# Patient Record
Sex: Female | Born: 1945 | Race: White | Hispanic: No | Smoking: Never smoker
Health system: Southern US, Community
[De-identification: ages and names within clinical notes are randomized; demographics above are authoritative.]

## PROBLEM LIST (undated history)

## (undated) DIAGNOSIS — I10 Essential (primary) hypertension: Secondary | ICD-10-CM

## (undated) DIAGNOSIS — E785 Hyperlipidemia, unspecified: Secondary | ICD-10-CM

## (undated) DIAGNOSIS — I272 Pulmonary hypertension, unspecified: Secondary | ICD-10-CM

## (undated) DIAGNOSIS — M5136 Other intervertebral disc degeneration, lumbar region: Secondary | ICD-10-CM

## (undated) DIAGNOSIS — I350 Nonrheumatic aortic (valve) stenosis: Secondary | ICD-10-CM

## (undated) DIAGNOSIS — M51369 Other intervertebral disc degeneration, lumbar region without mention of lumbar back pain or lower extremity pain: Secondary | ICD-10-CM

## (undated) DIAGNOSIS — I5042 Chronic combined systolic (congestive) and diastolic (congestive) heart failure: Secondary | ICD-10-CM

## (undated) DIAGNOSIS — I340 Nonrheumatic mitral (valve) insufficiency: Secondary | ICD-10-CM

## (undated) DIAGNOSIS — I739 Peripheral vascular disease, unspecified: Secondary | ICD-10-CM

## (undated) DIAGNOSIS — E041 Nontoxic single thyroid nodule: Secondary | ICD-10-CM

## (undated) DIAGNOSIS — N189 Chronic kidney disease, unspecified: Secondary | ICD-10-CM

## (undated) DIAGNOSIS — Z952 Presence of prosthetic heart valve: Secondary | ICD-10-CM

## (undated) DIAGNOSIS — K219 Gastro-esophageal reflux disease without esophagitis: Secondary | ICD-10-CM

## (undated) DIAGNOSIS — I2510 Atherosclerotic heart disease of native coronary artery without angina pectoris: Secondary | ICD-10-CM

## (undated) DIAGNOSIS — E119 Type 2 diabetes mellitus without complications: Secondary | ICD-10-CM

## (undated) HISTORY — DX: Atherosclerotic heart disease of native coronary artery without angina pectoris: I25.10

## (undated) HISTORY — DX: Nonrheumatic mitral (valve) insufficiency: I34.0

## (undated) HISTORY — DX: Hyperlipidemia, unspecified: E78.5

## (undated) HISTORY — DX: Pulmonary hypertension, unspecified: I27.20

## (undated) HISTORY — DX: Essential (primary) hypertension: I10

## (undated) HISTORY — DX: Type 2 diabetes mellitus without complications: E11.9

## (undated) HISTORY — DX: Nontoxic single thyroid nodule: E04.1

## (undated) HISTORY — DX: Chronic kidney disease, unspecified: N18.9

## (undated) HISTORY — DX: Peripheral vascular disease, unspecified: I73.9

## (undated) HISTORY — DX: Other intervertebral disc degeneration, lumbar region: M51.36

## (undated) HISTORY — DX: Other intervertebral disc degeneration, lumbar region without mention of lumbar back pain or lower extremity pain: M51.369

## (undated) HISTORY — DX: Gastro-esophageal reflux disease without esophagitis: K21.9

## (undated) HISTORY — PX: PARTIAL HYSTERECTOMY: SHX80

---

## 2006-12-15 HISTORY — PX: PERCUTANEOUS CORONARY STENT INTERVENTION (PCI-S): SHX6016

## 2007-12-16 HISTORY — PX: CORONARY ARTERY BYPASS GRAFT: SHX141

## 2013-10-11 DIAGNOSIS — Z9889 Other specified postprocedural states: Secondary | ICD-10-CM | POA: Insufficient documentation

## 2017-01-07 DIAGNOSIS — Z7902 Long term (current) use of antithrombotics/antiplatelets: Secondary | ICD-10-CM | POA: Insufficient documentation

## 2018-03-04 ENCOUNTER — Institutional Professional Consult (permissible substitution) (INDEPENDENT_AMBULATORY_CARE_PROVIDER_SITE_OTHER): Payer: Medicare Other | Admitting: Thoracic Surgery (Cardiothoracic Vascular Surgery)

## 2018-03-04 VITALS — BP 133/63 | HR 62 | Resp 20 | Ht 63.0 in

## 2018-03-04 DIAGNOSIS — I272 Pulmonary hypertension, unspecified: Secondary | ICD-10-CM | POA: Insufficient documentation

## 2018-03-04 DIAGNOSIS — E785 Hyperlipidemia, unspecified: Secondary | ICD-10-CM | POA: Insufficient documentation

## 2018-03-04 DIAGNOSIS — I350 Nonrheumatic aortic (valve) stenosis: Secondary | ICD-10-CM | POA: Diagnosis not present

## 2018-03-04 DIAGNOSIS — E119 Type 2 diabetes mellitus without complications: Secondary | ICD-10-CM | POA: Insufficient documentation

## 2018-03-04 DIAGNOSIS — Z951 Presence of aortocoronary bypass graft: Secondary | ICD-10-CM | POA: Diagnosis not present

## 2018-03-04 DIAGNOSIS — N183 Chronic kidney disease, stage 3 unspecified: Secondary | ICD-10-CM | POA: Insufficient documentation

## 2018-03-04 DIAGNOSIS — I10 Essential (primary) hypertension: Secondary | ICD-10-CM | POA: Insufficient documentation

## 2018-03-04 DIAGNOSIS — I5032 Chronic diastolic (congestive) heart failure: Secondary | ICD-10-CM | POA: Diagnosis not present

## 2018-03-04 NOTE — Patient Instructions (Signed)
Continue all previous medications without any changes at this time  

## 2018-03-04 NOTE — Progress Notes (Signed)
[facility]  MULTIDISCIPLINARY HEART VALVE CLINIC  CARDIOTHORACIC SURGERY CONSULTATION REPORT  Referring Provider is Sherrill Raring, MD PCP is Sherrill Raring, MD  Chief Complaint  Patient presents with  . Aortic Stenosis    Surgical eval, ECHO 02/02/18, HX of CABG 2008    HPI:  Patient is a 72 year old moderately obese African-American female with history of aortic stenosis, coronary artery disease status post coronary artery bypass grafting in the distant past, hypertension, chronic diastolic congestive heart failure, stage III-IV chronic kidney disease, type 2 diabetes mellitus, and hyperlipidemia who has been referred for surgical consultation to discuss treatment options for management of severe symptomatic aortic stenosis.  Patient states that she has been told that she had a heart murmur for most of her life.  Approximately 12 years ago she developed symptomatic ischemic heart disease and underwent PCI and stenting.  At that time she lived in [place].  According to the patient's recollection her stents became restenotic fairly quickly and she eventually underwent coronary artery bypass grafting at [facility] in [place] in 2009.  She reportedly recovered from her surgery reasonably well although she had some mild memory loss and cognitive decline.  She retired following her surgery, having previously worked for the [employer].  She eventually moved to [facility] to live with 2 of her sisters approximately 5 years ago.  She has been followed by Dr. Hanley Hays and Arnette Felts ever since.  She has had known systolic murmur and transthoracic echocardiograms have demonstrated the presence of aortic stenosis that has gradually progressed in severity.  Recent follow-up echocardiogram revealed normal left ventricular systolic function with ejection fraction estimated 55-60%.  There was severe aortic stenosis with peak velocity across  the aortic valve measured at size 4.1 m/s corresponding to mean transvalvular gradient estimated 45 mmHg and aortic valve area estimated 0.6cm.  The patient was referred for elective surgical consultation.  The patient is separated from her husband who still lives in [place].  She lives with 2 of her sisters.  She has a total of 8 sisters and 2 brothers.  She has 1 adult daughter who lives in [place].  She worked her entire career for the [employer] but has been retired for approximately 10 years.  She lives a sedentary lifestyle.  She admits that she is not very active at all.  She used to walk on a daily basis but she stopped doing this several years ago.  She claims that she simply quit because she is "lazy".  She does enjoy singing in the choir at church, and she has noted that she gets short of breath quite easily when she is singing in church.  She gets short of breath with more strenuous exertion, but for the most part she just avoids exerting herself altogether.  Her mobility is reasonably good although she does have some problems with chronic pain in both heels related to heel spurs.  She has some degenerative disease in her lower back.  She denies any resting shortness of breath, PND, or orthopnea.  She has had some lower extremity edema in the past but this has been improved on oral diuretic therapy.  She has not had any chest pain or chest tightness either with activity or at rest.  She has not had dizzy spells, nor syncope.  She reports occasional palpitations.  Past Medical History:  Diagnosis Date  . Aortic stenosis   .  CAD (coronary artery disease)   . Cardiomegaly   . Chronic diastolic (congestive) heart failure (HCC)   . Chronic kidney disease (CKD), stage III (moderate) (HCC)   . CKD (chronic kidney disease)    stage4  . Degenerative lumbar disc   . Diabetes type 2, controlled (HCC)   . Essential hypertension   . GERD (gastroesophageal reflux disease)   . High risk  medication use   . Hyperlipidemia   . Hyperlipidemia   . Insomnia   . Menopause   . Mitral regurgitation   . PAD (peripheral artery disease) (HCC)   . Palpitation   . Pulmonary hypertension (HCC)   . Sciatica   . Thyroid nodule   . Type II diabetes mellitus (HCC)     Past Surgical History:  Procedure Laterality Date  . CORONARY ARTERY BYPASS GRAFT  2009   CABG x3 - [facility]  . PARTIAL HYSTERECTOMY    . PERCUTANEOUS CORONARY STENT INTERVENTION (PCI-S)  2008   details unclear    Family History  Problem Relation Age of Onset  . Diabetes Mother   . Diabetes Sister   . Hyperlipidemia Sister   . Heart disease Brother     Social History   Socioeconomic History  . Marital status: Legally Separated    Spouse name: Not on file  . Number of children: Not on file  . Years of education: Not on file  . Highest education level: Not on file  Occupational History  . Not on file  Social Needs  . Financial resource strain: Not on file  . Food insecurity:    Worry: Not on file    Inability: Not on file  . Transportation needs:    Medical: Not on file    Non-medical: Not on file  Tobacco Use  . Smoking status: Current Every Day Smoker    Types: Cigarettes  . Smokeless tobacco: Never Used  Substance and Sexual Activity  . Alcohol use: Not Currently    Frequency: Never  . Drug use: Never  . Sexual activity: Not Currently  Lifestyle  . Physical activity:    Days per week: Not on file    Minutes per session: Not on file  . Stress: Not on file  Relationships  . Social connections:    Talks on phone: Not on file    Gets together: Not on file    Attends religious service: Not on file    Active member of club or organization: Not on file    Attends meetings of clubs or organizations: Not on file    Relationship status: Not on file  . Intimate partner violence:    Fear of current or ex partner: Not on file    Emotionally abused: Not on file    Physically  abused: Not on file    Forced sexual activity: Not on file  Other Topics Concern  . Not on file  Social History Narrative  . Not on file    Current Outpatient Medications  Medication Sig Dispense Refill  . amLODipine (NORVASC) 10 MG tablet Take 10 mg by mouth daily.    Marland Kitchen aspirin EC 81 MG tablet Take 81 mg by mouth daily.    Marland Kitchen atorvastatin (LIPITOR) 20 MG tablet Take 20 mg by mouth daily.    . Cholecalciferol (VITAMIN D3) 2000 units TABS Take by mouth daily.    . clopidogrel (PLAVIX) 75 MG tablet Take 75 mg by mouth daily.    Marland Kitchen  co-enzyme Q-10 30 MG capsule Take 30 mg by mouth daily.    Marland Kitchen. glipiZIDE (GLUCOTROL XL) 10 MG 24 hr tablet Take 10 mg by mouth daily with breakfast.    . linagliptin (TRADJENTA) 5 MG TABS tablet Take 5 mg by mouth daily.    Marland Kitchen. losartan (COZAAR) 50 MG tablet Take 50 mg by mouth daily.    . Multiple Vitamin (MULTIVITAMIN) tablet Take 1 tablet by mouth daily.    Marland Kitchen. omeprazole (PRILOSEC) 20 MG capsule Take 20 mg by mouth daily.     No current facility-administered medications for this visit.     Allergies  Allergen Reactions  . Penicillins Other (See Comments)    Yeast infection      Review of Systems:   General:  decreased appetite, normal energy, no weight gain, 15-20 lb weight loss, no fever  Cardiac:  no chest pain with exertion, no chest pain at rest, +SOB with exertion, no resting SOB, no PND, no orthopnea, occasional palpitations, no arrhythmia, no atrial fibrillation, no LE edema, no dizzy spells, no syncope  Respiratory:  no shortness of breath, no home oxygen, no productive cough, no dry cough, no bronchitis, no wheezing, no hemoptysis, no asthma, no pain with inspiration or cough, no sleep apnea, no CPAP at night  GI:   no difficulty swallowing, no reflux, no frequent heartburn, no hiatal hernia, no abdominal pain, no constipation, no diarrhea, no hematochezia, no hematemesis, no melena  GU:   no dysuria,  no frequency, no urinary tract infection, no  hematuria, no kidney stones, + kidney disease  Vascular:  no pain suggestive of claudication, + pain in feet, no leg cramps, no varicose veins, no DVT, no non-healing foot ulcer  Neuro:   no stroke, no TIA's, no seizures, no headaches, no temporary blindness one eye,  no slurred speech, no peripheral neuropathy, no chronic pain, no instability of gait,  mild memory/cognitive dysfunction  Musculoskeletal: + arthritis, no joint swelling, no myalgias, mild difficulty walking, normal mobility   Skin:   no rash, no itching, no skin infections, no pressure sores or ulcerations  Psych:   no anxiety, no depression, no nervousness, no unusual recent stress  Eyes:   no blurry vision, no floaters, no recent vision changes, does not wears glasses or contacts  ENT:   no hearing loss, no loose or painful teeth, + dentures, last saw dentist 6 months ago  Hematologic:  no easy bruising, no abnormal bleeding, no clotting disorder, no frequent epistaxis  Endocrine:  + diabetes, does not check CBG's at home           Physical Exam:   BP 133/63   Pulse 62   Resp 20   Ht 5\' 3"  (1.6 m)   SpO2 97%   General:  Moderately obese,  well-appearing  HEENT:  Unremarkable   Neck:   no JVD, no bruits, no adenopathy   Chest:   clear to auscultation, symmetrical breath sounds, no wheezes, no rhonchi   CV:   RRR, grade III/VI crescendo/decrescendo murmur heard best at LLSB,  no diastolic murmur  Abdomen:  soft, non-tender, no masses   Extremities:  warm, well-perfused, pulses diminished, no LE edema  Rectal/GU  Deferred  Neuro:   Grossly non-focal and symmetrical throughout  Skin:   Clean and dry, no rashes, no breakdown   Diagnostic Tests:  TRANSTHORACIC ECHOCARDIOGRAM  Both images and report from transthoracic echocardi[facility] February 02, 2018 [facility] are reviewed.  Patient  has what appears to be mildly reduced left ventricular function.  There appears to be some inferior wall  hypokinesis.  Ejection fraction was reported 55-60%.  There is severe aortic stenosis.  The aortic valve appears trileaflet but may be functionally bicuspid with a single fused raphae.  The right coronary leaflet appears to move fairly well on the other conjoined leaflets are severely thickened, calcified, and restricted in mobility.  Peak velocity across the aortic valve was measured as high as 4.1 m/s corresponding to mean transvalvular gradient estimated 45 mmHg and aortic valve area calculated 0.6 cm.  There was mild mitral regurgitation and mild tricuspid regurgitation.  There were findings suggestive of severe pulmonary hypertension.   Impression:  Patient has stage D severe symptomatic aortic stenosis.  She describes stable symptoms of mild exertional shortness of breath and fatigue that only occurs with more strenuous physical exertion, consistent with chronic diastolic congestive heart failure New York Heart Association functional class II.  I have personally reviewed the patient's recent transthoracic echocardiogram.  Findings are consistent with severe aortic stenosis and mildly reduced left ventricular systolic function.  I agree the patient would likely benefit from aortic valve replacement.  The current status of the patient's coronary artery disease remains unknown.  Risks associated with conventional surgery would be at least moderately elevated because of the patient's age, previous coronary artery bypass grafting, and numerous comorbid risk factors.  She might be an excellent candidate for transcatheter aortic valve replacement.   Plan:  The patient and 2 of her sisters were counseled at length regarding treatment alternatives for management of severe symptomatic aortic stenosis. Alternative approaches such as conventional aortic valve replacement, transcatheter aortic valve replacement, and continued medical therapy without intervention were compared and contrasted at length.  The risks  associated with conventional surgical aortic valve replacement were discussed in detail, as were expectations for post-operative convalescence, and why I would be reluctant to consider this patient a candidate for conventional surgery.  Issues specific to transcatheter aortic valve replacement were discussed including questions about long term valve durability, the potential for paravalvular leak, possible increased risk of need for permanent pacemaker placement, and other technical complications related to the procedure itself.  Long-term prognosis with medical therapy was discussed. This discussion was placed in the context of the patient's own specific clinical presentation and past medical history.  All of their questions have been addressed.  The patient is interested in proceeding with further diagnostic workup to consider transcatheter aortic valve replacement in the near future.  As a next step the patient will undergo left and right heart catheterization.  We will obtain a follow-up echocardiogram at that time.  Because of her underlying history of chronic kidney disease, we will need to check a basic metabolic panel prior to catheterization.  She may need IV hydration.  Once catheterization has been performed she will also need CT angiography, but this will need to be staged over a period of time to allow her kidneys to recover completely.  She will be evaluated by a multidisciplinary team and specialist once all of her diagnostic tests have been completed.  At that time we will make definitive recommendations and plans for treatment.  All questions answered.   I spent in excess of 90 minutes during the conduct of this office consultation and >50% of this time involved direct face-to-face encounter with the patient for counseling and/or coordination of their care.    Salvatore Decent. Cornelius Moras, MD 03/04/2018 5:31 PM

## 2018-03-10 ENCOUNTER — Telehealth: Payer: Self-pay

## 2018-03-10 NOTE — Telephone Encounter (Signed)
Patient states she will not be able to attend TAVR consult tomorrow due to transportation issues (her driver is sick). She understands her visit tomorrow will be cancelled and the TAVR nurse will call tomorrow to reschedule appointment and cath.

## 2018-03-10 NOTE — Progress Notes (Deleted)
Valve Clinic Note  No chief complaint on file.  History of Present Illness: 72 yo female with history of CAD s/p CABG, HTN, chronic diastolic CHF, stage 3 CKD, DM, HLD and severe aortic stenosis who is here today in the valve clinic to discuss her aortic stenosis and possible TAVR. She is followed in the [facility] by Dr. Hanley Hays. She had the diagnosis of CAD in 2006 in [place] and had stents placed but early stent failure and then CABG in 2009. She has had a murmur for many years. Most recent echo in the [facility] with LVEF=55-60%. There was severe aortic stenosis with mean gradient 45 mmHg and AVA of 0.6 cm2. She has been seen in the CT surgery office by Dr. Cornelius Moras and is felt to be a good candidate for TAVR.   She tells me today that she has had dyspnea with moderate exertion and when singing in the choir at church. She denies dizziness, near syncope or syncope. No chest pain. Lower extremity edema has been controlled on oral Lasix.   She previously lived in [place] and worked for the [employer] but moved to [place], [place] in 2013. She is separated from her husband who lives in [place]. She is now retired and lives with 2 of her sisters.   Primary Care Physician: Sherrill Raring, MD  Primary Cardiologist: *** Referring Physician: Cornelius Moras  Past Medical History:  Diagnosis Date  . Aortic stenosis   . CAD (coronary artery disease)   . Cardiomegaly   . Chronic diastolic (congestive) heart failure (HCC)   . Chronic kidney disease (CKD), stage III (moderate) (HCC)   . CKD (chronic kidney disease)    stage4  . Degenerative lumbar disc   . Diabetes type 2, controlled (HCC)   . Essential hypertension   . GERD (gastroesophageal reflux disease)   . High risk medication use   . Hyperlipidemia   . Hyperlipidemia   . Insomnia   . Menopause   . Mitral regurgitation   . PAD (peripheral artery disease) (HCC)   . Palpitation   . Pulmonary hypertension (HCC)   . Sciatica    . Thyroid nodule   . Type II diabetes mellitus (HCC)     Past Surgical History:  Procedure Laterality Date  . CORONARY ARTERY BYPASS GRAFT  2009   CABG x3 - [facility]  . PARTIAL HYSTERECTOMY    . PERCUTANEOUS CORONARY STENT INTERVENTION (PCI-S)  2008   details unclear    Current Outpatient Medications  Medication Sig Dispense Refill  . amLODipine (NORVASC) 10 MG tablet Take 10 mg by mouth daily.    Marland Kitchen aspirin EC 81 MG tablet Take 81 mg by mouth daily.    Marland Kitchen atorvastatin (LIPITOR) 20 MG tablet Take 20 mg by mouth daily.    . Biotin w/ Vitamins C & E (HAIR/SKIN/NAILS PO) Take 1 tablet by mouth daily.    . carvedilol (COREG) 25 MG tablet Take 25 mg by mouth 2 (two) times daily with a meal.    . clopidogrel (PLAVIX) 75 MG tablet Take 75 mg by mouth daily.    Marland Kitchen co-enzyme Q-10 30 MG capsule Take 30 mg by mouth daily.    . furosemide (LASIX) 20 MG tablet Take 20 mg by mouth every other day.    Marland Kitchen glipiZIDE (GLUCOTROL XL) 10 MG 24 hr tablet Take 10 mg by mouth daily with breakfast.    . linagliptin (TRADJENTA) 5  MG TABS tablet Take 5 mg by mouth daily.    Marland Kitchen losartan (COZAAR) 50 MG tablet Take 50 mg by mouth daily.    . Multiple Vitamin (MULTIVITAMIN) tablet Take 1 tablet by mouth daily.    Marland Kitchen omeprazole (PRILOSEC) 20 MG capsule Take 20 mg by mouth daily.    Marland Kitchen Propylene Glycol (SYSTANE BALANCE) 0.6 % SOLN Place 1 drop into both eyes 2 (two) times daily as needed (eye irritation).     No current facility-administered medications for this visit.     Allergies  Allergen Reactions  . Penicillins Other (See Comments)    Yeast infection    Social History   Socioeconomic History  . Marital status: Legally Separated    Spouse name: Not on file  . Number of children: Not on file  . Years of education: Not on file  . Highest education level: Not on file  Occupational History  . Not on file  Social Needs  . Financial resource strain: Not on file  . Food insecurity:     Worry: Not on file    Inability: Not on file  . Transportation needs:    Medical: Not on file    Non-medical: Not on file  Tobacco Use  . Smoking status: Current Every Day Smoker    Types: Cigarettes  . Smokeless tobacco: Never Used  Substance and Sexual Activity  . Alcohol use: Not Currently    Frequency: Never  . Drug use: Never  . Sexual activity: Not Currently  Lifestyle  . Physical activity:    Days per week: Not on file    Minutes per session: Not on file  . Stress: Not on file  Relationships  . Social connections:    Talks on phone: Not on file    Gets together: Not on file    Attends religious service: Not on file    Active member of club or organization: Not on file    Attends meetings of clubs or organizations: Not on file    Relationship status: Not on file  . Intimate partner violence:    Fear of current or ex partner: Not on file    Emotionally abused: Not on file    Physically abused: Not on file    Forced sexual activity: Not on file  Other Topics Concern  . Not on file  Social History Narrative  . Not on file    Family History  Problem Relation Age of Onset  . Diabetes Mother   . Diabetes Sister   . Hyperlipidemia Sister   . Heart disease Brother     Review of Systems:  As stated in the HPI and otherwise negative.   There were no vitals taken for this visit.  Physical Examination: General: Well developed, well nourished, NAD  HEENT: OP clear, mucus membranes moist  SKIN: warm, dry. No rashes. Neuro: No focal deficits  Musculoskeletal: Muscle strength 5/5 all ext  Psychiatric: Mood and affect normal  Neck: No JVD, no carotid bruits, no thyromegaly, no lymphadenopathy.  Lungs:Clear bilaterally, no wheezes, rhonci, crackles Cardiovascular: Regular rate and rhythm. *** Loud, harsh, late peaking systolic murmur.  Abdomen:Soft. Bowel sounds present. Non-tender.  Extremities: No lower extremity edema. Pulses are 2 + in the bilateral DP/PT.  Echo  02/02/18: Patient has what appears to be mildly reduced left ventricular function.  There appears to be some inferior wall hypokinesis.  Ejection fraction was reported 55-60%.  There is severe aortic stenosis.  The aortic valve  appears trileaflet but may be functionally bicuspid with a single fused raphae.  The right coronary leaflet appears to move fairly well on the other conjoined leaflets are severely thickened, calcified, and restricted in mobility.  Peak velocity across the aortic valve was measured as high as 4.1 m/s corresponding to mean transvalvular gradient estimated 45 mmHg and aortic valve area calculated 0.6 cm.  There was mild mitral regurgitation and mild tricuspid regurgitation.  There were findings suggestive of severe pulmonary hypertension.   EKG:  EKG {ACTION; IS/IS [id number]} ordered today. The ekg ordered today demonstrates ***  Recent Labs: No results found for requested labs within last 8760 hours.   Lipid Panel No results found for: CHOL, TRIG, HDL, CHOLHDL, VLDL, LDLCALC, LDLDIRECT   Wt Readings from Last 3 Encounters:  No data found for Wt     Other studies Reviewed: Additional studies/ records that were reviewed today include: ***. Review of the above records demonstrates: ***   Assessment and Plan:   1. Severe Aortic stenosis: *** has severe, stage D aortic valve stenosis. I have personally reviewed the echo images. The aortic valve is thickened, calcified with limited leaflet mobility. I think *** would benefit from AVR. Given advanced age, *** is not a good candidate for conventional AVR by surgical approach. I think *** may be a good candidate for TAVR.  I have reviewed the natural history of aortic stenosis with the patient and their family members  who are present today. We have discussed the limitations of medical therapy and the poor prognosis associated with symptomatic aortic stenosis. We have reviewed potential treatment options, including  palliative medical therapy, conventional surgical aortic valve replacement, and transcatheter aortic valve replacement. We discussed treatment options in the context of the patient's specific comorbid medical conditions.   STS Risk Score: ***   *** would like to proceed with planning for TAVR. I will [facility] ***. Risks and benefits of procedure reviewed with the patient. After the cath, *** will have a cardiac CT, CTA of the chest/abdomen and pelvis, PFTs and will then be referred to see one of the CT surgeons on our TAVR team.    Current medicines are reviewed at length with the patient today.  The patient {ACTIONS; HAS/DOES NOT HAVE:19233} concerns regarding medicines.  The following changes have been made:  {PLAN; NO CHANGE:13088:s}  Labs/ tests ordered today include: *** No orders of the defined types were placed in this encounter.    Disposition:   FU with *** in {gen number [id number]}0-10:310397} {TIME; UNITS DAY/WEEK/MONTH:19136}   Signed, Verne Carrowhristopher Claxton Levitz, MD 03/10/2018 4:51 PM    [facility] [address], [place], [place]  [address] Phone: [phone]; Fax: [phone]

## 2018-03-11 ENCOUNTER — Institutional Professional Consult (permissible substitution): Payer: Medicare Other | Admitting: Cardiovascular Disease

## 2018-03-11 NOTE — Telephone Encounter (Signed)
I spoke with the pt's sister Luetta NuttingHattie and rescheduled the pt's TAVR consult to 4/5 with Dr Excell Seltzerooper.  I rescheduled the pt's cardiac catheterization to 4/10 at 1:00 pm with Dr Excell Seltzerooper.  I will update the pre-cath instructions in the pt's chart. The pt will need pre cath labs at 4/5 office visit.

## 2018-03-18 NOTE — H&P (View-Only) (Signed)
Cardiology Office Note Date:  03/19/2018   ID:  [name], [name] [birth year], MRN [id number]  PCP:  Sherrill Raring, MD  Cardiologist:  Lollie Marrow, MD  Chief Complaint  Patient presents with  . Aortic Stenosis   History of Present Illness: Sydney Ingram is a 72 y.o. female who presents for evaluation of severe aortic stenosis, initially referred to Dr Cornelius Moras and now presenting for further Valve Team evaluation.   The patient has a hx of CAD and initially underwent stenting followed by multivessel CABG in 2009. Told she had 'triple bypass.'  Her heart surgery was performed in [place] at [facility]  She has a longstanding heart murmur and has been followed for aortic stenosis with serial echo studies over the last several years. Her most recent echo showed normal LV systolic function with LVEF 55-60%, severe aortic stenosis with a peak systolic velocity of 4.1 m/s, mean gradient 45 mmHg, and calculated AVA 0.6 square cm.   She is here with her 2 sisters today. She has had a heart murmur for many years. She is quite sedentary, states she watches TV most of the time. She used to walk around their complex last year but doesn't do much walking anymore. 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  States she would be short of breath if she got in a hurry.  She denies chest pain, chest pressure, lightheadedness, or syncope.  She has had some issues with "sciatica" but otherwise does not have any specific physical limitation.  She has no history of stroke or TIA.  She is been maintained on dual antiplatelet therapy with aspirin and clopidogrel for many years without bleeding problems.  She has long-standing type 2 diabetes managed with oral hypoglycemics.  She has never been on insulin.  Past Medical History:  Diagnosis Date  . Aortic stenosis   . CAD (coronary artery  disease)   . Cardiomegaly   . Chronic diastolic (congestive) heart failure (HCC)   . Chronic kidney disease (CKD), stage III (moderate) (HCC)   . CKD (chronic kidney disease)    stage4  . Degenerative lumbar disc   . Diabetes type 2, controlled (HCC)   . Essential hypertension   . GERD (gastroesophageal reflux disease)   . High risk medication use   . Hyperlipidemia   . Hyperlipidemia   . Insomnia   . Menopause   . Mitral regurgitation   . PAD (peripheral artery disease) (HCC)   . Palpitation   . Pulmonary hypertension (HCC)   . Sciatica   . Thyroid nodule   . Type II diabetes mellitus (HCC)     Past Surgical History:  Procedure Laterality Date  . CORONARY ARTERY BYPASS GRAFT  2009   CABG x3 - [facility]  . PARTIAL HYSTERECTOMY    . PERCUTANEOUS CORONARY STENT INTERVENTION (PCI-S)  2008   details unclear    Current Outpatient Medications  Medication Sig Dispense Refill  . amLODipine (NORVASC) 10 MG tablet Take 10 mg by mouth daily.    Marland Kitchen aspirin EC 81 MG tablet Take 81 mg by mouth daily.    Marland Kitchen atorvastatin (LIPITOR) 20 MG tablet Take 20 mg by mouth daily.    . Biotin w/ Vitamins C & E (HAIR/SKIN/NAILS PO) Take 1 tablet by mouth daily.    . carvedilol (COREG) 25 MG  tablet Take 25 mg by mouth 2 (two) times daily with a meal.    . clopidogrel (PLAVIX) 75 MG tablet Take 75 mg by mouth daily.    Marland Kitchen. co-enzyme Q-10 30 MG capsule Take 30 mg by mouth daily.    . furosemide (LASIX) 20 MG tablet Take 20 mg by mouth every other day.    Marland Kitchen. glipiZIDE (GLUCOTROL XL) 10 MG 24 hr tablet Take 10 mg by mouth daily with breakfast.    . linagliptin (TRADJENTA) 5 MG TABS tablet Take 5 mg by mouth daily.    Marland Kitchen. losartan (COZAAR) 50 MG tablet Take 50 mg by mouth daily.    . Multiple Vitamin (MULTIVITAMIN) tablet Take 1 tablet by mouth daily.    Marland Kitchen. omeprazole (PRILOSEC) 20 MG capsule Take 20 mg by mouth daily.    Marland Kitchen. Propylene Glycol (SYSTANE BALANCE) 0.6 % SOLN Place 1 drop into both  eyes 2 (two) times daily as needed (eye irritation).     No current facility-administered medications for this visit.     Allergies:   Penicillins   Social History:  The patient  reports that she has been smoking cigarettes.  She has never used smokeless tobacco. She reports that she drank alcohol. She reports that she does not use drugs.   Family History:  The patient's family history includes Diabetes in her mother and sister; Heart disease in her brother; Hyperlipidemia in her sister.    ROS:  Please see the history of present illness.  Otherwise, review of systems is positive for 'sciatica.'  All other systems are reviewed and negative.   PHYSICAL EXAM: VS:  BP 130/80   Pulse 67   Ht 5\' 3"  (1.6 m)   Wt 166 lb (75.3 kg)   SpO2 98%   BMI 29.41 kg/m  , BMI Body mass index is 29.41 kg/m. GEN: Well nourished, well developed, in no acute distress  HEENT: normal  Neck: no JVD, no masses. BL carotid bruits Cardiac: RRR with 3/6 harsh crescendo decrescendo murmur at the right upper sternal border, no diastolic murmur, A2 is present but diminished              Respiratory:  clear to auscultation bilaterally, normal work of breathing GI: soft, nontender, nondistended, + BS MS: no deformity or atrophy  Ext: no pretibial edema, pedal pulses 2+= bilaterally Skin: warm and dry, no rash Neuro:  Strength and sensation are intact Psych: euthymic mood, full affect  EKG:  EKG is not ordered today.  Recent Labs: No results found for requested labs within last 8760 hours.   Lipid Panel  No results found for: CHOL, TRIG, HDL, CHOLHDL, VLDL, LDLCALC, LDLDIRECT    Wt Readings from Last 3 Encounters:  03/19/18 166 lb (75.3 kg)    STS RIsk Calculator: Risk of Mortality: 5.305% Renal Failure: 5.133% Permanent Stroke: 3.755% Prolonged Ventilation: 18.353% DSW Infection: 0.133% Reoperation: 6.121% Morbidity or Mortality: 23.970% Short Length of Stay: 22.283% Long Length of Stay:  9.812%  ASSESSMENT AND PLAN: This is a 72 year old woman with severe, stage D1, symptomatic aortic stenosis.  The patient's echo findings are reviewed and confirm severe aortic stenosis with a peak systolic velocity greater than 4 m/s and a mean gradient of 45 mmHg.  The patient has New York Heart Association functional class II symptoms of chronic diastolic heart failure with preserved LV ejection fraction, likely related to her aortic valve disease.  I have reviewed the natural history of aortic stenosis with the patient  and their family members who are present today. We have discussed the limitations of medical therapy and the poor prognosis associated with symptomatic aortic stenosis. We have reviewed potential treatment options, including palliative medical therapy, conventional surgical aortic valve replacement, and transcatheter aortic valve replacement. We discussed treatment options in the context of the patient's specific comorbid medical conditions.  Considering the patient's previous CABG, diabetes, limited functional capacity, stage III chronic kidney disease, and mild memory impairment, TAVR would be a reasonable treatment option for this patient.  We discussed that T AVR procedure in detail today, including necessary pre-TAVR evaluation with cardiac catheterization and CTA studies followed by multidisciplinary heart valve team review of her case.  We also reviewed potential complications, expected benefits, and typical recovery.  All of their questions are answered today.  Next step will be right and left heart catheterization.   I have reviewed the risks, indications, and alternatives to cardiac catheterization, possible angioplasty, and stenting with the patient. Risks include but are not limited to bleeding, infection, vascular injury, stroke, myocardial infection, arrhythmia, kidney injury, radiation-related injury in the case of prolonged fluoroscopy use, emergency cardiac surgery, and  death. The patient understands the risks of serious complication is 1-2 in 1000 with diagnostic cardiac cath and 1-2% or less with angioplasty/stenting.  The patient's CTA studies will have to be staged after cardiac catheterization in the context of the patient's chronic kidney disease.  Labs will be drawn today.  Losartan and furosemide will be held prior to her cardiac catheterization procedure.  Current medicines are reviewed with the patient today.  The patient does not have concerns regarding medicines.  Labs/ tests ordered today include:   Orders Placed This Encounter  Procedures  . CT CORONARY MORPH W/CTA COR W/SCORE W/CA W/CM &/OR WO/CM  . CT ANGIO ABDOMEN PELVIS  W &/OR WO CONTRAST  . CT ANGIO CHEST AORTA W &/OR WO CONTRAST  . Basic metabolic panel  . CBC with Differential/Platelet  . INR/PT  . Pulmonary Function Test    Disposition:   FU as planned  Signed, Sydney Bollman, MD  03/19/2018 8:59 AM    [facility] [address] [place], [place], [place]  [address] Phone: [phone]; Fax: [phone]

## 2018-03-18 NOTE — Progress Notes (Signed)
Cardiology Office Note Date:  03/19/2018   ID:  Sydney Ingram, Sydney Ingram [birth year], MRN [id number]  PCP:  Sherrill Raring, MD  Cardiologist:  Lollie Marrow, MD  Chief Complaint  Patient presents with  . Aortic Stenosis   History of Present Illness: Sydney Ingram is a 72 y.o. female who presents for evaluation of severe aortic stenosis, initially referred to Dr Cornelius Moras and now presenting for further Valve Team evaluation.   The patient has a hx of CAD and initially underwent stenting followed by multivessel CABG in 2009. Told she had 'triple bypass.'  Her heart surgery was performed in [place] at [facility]  She has a longstanding heart murmur and has been followed for aortic stenosis with serial echo studies over the last several years. Her most recent echo showed normal LV systolic function with LVEF 55-60%, severe aortic stenosis with a peak systolic velocity of 4.1 m/s, mean gradient 45 mmHg, and calculated AVA 0.6 square cm.   She is here with her 2 sisters today. She has had a heart murmur for many years. She is quite sedentary, states she watches TV most of the time. She used to walk around their complex last year but doesn't do much walking anymore. 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  States she would be short of breath if she got in a hurry.  She denies chest pain, chest pressure, lightheadedness, or syncope.  She has had some issues with "sciatica" but otherwise does not have any specific physical limitation.  She has no history of stroke or TIA.  She is been maintained on dual antiplatelet therapy with aspirin and clopidogrel for many years without bleeding problems.  She has long-standing type 2 diabetes managed with oral hypoglycemics.  She has never been on insulin.  Past Medical History:  Diagnosis Date  . Aortic stenosis   . CAD (coronary artery  disease)   . Cardiomegaly   . Chronic diastolic (congestive) heart failure (HCC)   . Chronic kidney disease (CKD), stage III (moderate) (HCC)   . CKD (chronic kidney disease)    stage4  . Degenerative lumbar disc   . Diabetes type 2, controlled (HCC)   . Essential hypertension   . GERD (gastroesophageal reflux disease)   . High risk medication use   . Hyperlipidemia   . Hyperlipidemia   . Insomnia   . Menopause   . Mitral regurgitation   . PAD (peripheral artery disease) (HCC)   . Palpitation   . Pulmonary hypertension (HCC)   . Sciatica   . Thyroid nodule   . Type II diabetes mellitus (HCC)     Past Surgical History:  Procedure Laterality Date  . CORONARY ARTERY BYPASS GRAFT  2009   CABG x3 - [facility]  . PARTIAL HYSTERECTOMY    . PERCUTANEOUS CORONARY STENT INTERVENTION (PCI-S)  2008   details unclear    Current Outpatient Medications  Medication Sig Dispense Refill  . amLODipine (NORVASC) 10 MG tablet Take 10 mg by mouth daily.    Marland Kitchen aspirin EC 81 MG tablet Take 81 mg by mouth daily.    Marland Kitchen atorvastatin (LIPITOR) 20 MG tablet Take 20 mg by mouth daily.    . Biotin w/ Vitamins C & E (HAIR/SKIN/NAILS PO) Take 1 tablet by mouth daily.    . carvedilol (COREG) 25 MG  tablet Take 25 mg by mouth 2 (two) times daily with a meal.    . clopidogrel (PLAVIX) 75 MG tablet Take 75 mg by mouth daily.    Marland Kitchen. co-enzyme Q-10 30 MG capsule Take 30 mg by mouth daily.    . furosemide (LASIX) 20 MG tablet Take 20 mg by mouth every other day.    Marland Kitchen. glipiZIDE (GLUCOTROL XL) 10 MG 24 hr tablet Take 10 mg by mouth daily with breakfast.    . linagliptin (TRADJENTA) 5 MG TABS tablet Take 5 mg by mouth daily.    Marland Kitchen. losartan (COZAAR) 50 MG tablet Take 50 mg by mouth daily.    . Multiple Vitamin (MULTIVITAMIN) tablet Take 1 tablet by mouth daily.    Marland Kitchen. omeprazole (PRILOSEC) 20 MG capsule Take 20 mg by mouth daily.    Marland Kitchen. Propylene Glycol (SYSTANE BALANCE) 0.6 % SOLN Place 1 drop into both  eyes 2 (two) times daily as needed (eye irritation).     No current facility-administered medications for this visit.     Allergies:   Penicillins   Social History:  The patient  reports that she has been smoking cigarettes.  She has never used smokeless tobacco. She reports that she drank alcohol. She reports that she does not use drugs.   Family History:  The patient's family history includes Diabetes in her mother and sister; Heart disease in her brother; Hyperlipidemia in her sister.    ROS:  Please see the history of present illness.  Otherwise, review of systems is positive for 'sciatica.'  All other systems are reviewed and negative.   PHYSICAL EXAM: VS:  BP 130/80   Pulse 67   Ht 5\' 3"  (1.6 m)   Wt 166 lb (75.3 kg)   SpO2 98%   BMI 29.41 kg/m  , BMI Body mass index is 29.41 kg/m. GEN: Well nourished, well developed, in no acute distress  HEENT: normal  Neck: no JVD, no masses. BL carotid bruits Cardiac: RRR with 3/6 harsh crescendo decrescendo murmur at the right upper sternal border, no diastolic murmur, A2 is present but diminished              Respiratory:  clear to auscultation bilaterally, normal work of breathing GI: soft, nontender, nondistended, + BS MS: no deformity or atrophy  Ext: no pretibial edema, pedal pulses 2+= bilaterally Skin: warm and dry, no rash Neuro:  Strength and sensation are intact Psych: euthymic mood, full affect  EKG:  EKG is not ordered today.  Recent Labs: No results found for requested labs within last 8760 hours.   Lipid Panel  No results found for: CHOL, TRIG, HDL, CHOLHDL, VLDL, LDLCALC, LDLDIRECT    Wt Readings from Last 3 Encounters:  03/19/18 166 lb (75.3 kg)    STS RIsk Calculator: Risk of Mortality: 5.305% Renal Failure: 5.133% Permanent Stroke: 3.755% Prolonged Ventilation: 18.353% DSW Infection: 0.133% Reoperation: 6.121% Morbidity or Mortality: 23.970% Short Length of Stay: 22.283% Long Length of Stay:  9.812%  ASSESSMENT AND PLAN: This is a 72 year old woman with severe, stage D1, symptomatic aortic stenosis.  The patient's echo findings are reviewed and confirm severe aortic stenosis with a peak systolic velocity greater than 4 m/s and a mean gradient of 45 mmHg.  The patient has New York Heart Association functional class II symptoms of chronic diastolic heart failure with preserved LV ejection fraction, likely related to her aortic valve disease.  I have reviewed the natural history of aortic stenosis with the patient  and their family members who are present today. We have discussed the limitations of medical therapy and the poor prognosis associated with symptomatic aortic stenosis. We have reviewed potential treatment options, including palliative medical therapy, conventional surgical aortic valve replacement, and transcatheter aortic valve replacement. We discussed treatment options in the context of the patient's specific comorbid medical conditions.  Considering the patient's previous CABG, diabetes, limited functional capacity, stage III chronic kidney disease, and mild memory impairment, TAVR would be a reasonable treatment option for this patient.  We discussed that T AVR procedure in detail today, including necessary pre-TAVR evaluation with cardiac catheterization and CTA studies followed by multidisciplinary heart valve team review of her case.  We also reviewed potential complications, expected benefits, and typical recovery.  All of their questions are answered today.  Next step will be right and left heart catheterization.   I have reviewed the risks, indications, and alternatives to cardiac catheterization, possible angioplasty, and stenting with the patient. Risks include but are not limited to bleeding, infection, vascular injury, stroke, myocardial infection, arrhythmia, kidney injury, radiation-related injury in the case of prolonged fluoroscopy use, emergency cardiac surgery, and  death. The patient understands the risks of serious complication is 1-2 in 1000 with diagnostic cardiac cath and 1-2% or less with angioplasty/stenting.  The patient's CTA studies will have to be staged after cardiac catheterization in the context of the patient's chronic kidney disease.  Labs will be drawn today.  Losartan and furosemide will be held prior to her cardiac catheterization procedure.  Current medicines are reviewed with the patient today.  The patient does not have concerns regarding medicines.  Labs/ tests ordered today include:   Orders Placed This Encounter  Procedures  . CT CORONARY MORPH W/CTA COR W/SCORE W/CA W/CM &/OR WO/CM  . CT ANGIO ABDOMEN PELVIS  W &/OR WO CONTRAST  . CT ANGIO CHEST AORTA W &/OR WO CONTRAST  . Basic metabolic panel  . CBC with Differential/Platelet  . INR/PT  . Pulmonary Function Test    Disposition:   FU as planned  Signed, Tonny Bollman, MD  03/19/2018 8:59 AM    [facility] [address] [place], [place], [place]  [address] Phone: [phone]; Fax: [phone]

## 2018-03-19 ENCOUNTER — Encounter: Payer: Self-pay | Admitting: Cardiovascular Disease

## 2018-03-19 ENCOUNTER — Ambulatory Visit (INDEPENDENT_AMBULATORY_CARE_PROVIDER_SITE_OTHER): Payer: Medicare Other | Admitting: Cardiovascular Disease

## 2018-03-19 VITALS — BP 130/80 | HR 67 | Ht 63.0 in | Wt 166.0 lb

## 2018-03-19 DIAGNOSIS — I350 Nonrheumatic aortic (valve) stenosis: Secondary | ICD-10-CM | POA: Diagnosis not present

## 2018-03-19 LAB — BASIC METABOLIC PANEL
BUN/Creatinine Ratio: 14 (ref 12–28)
BUN: 22 mg/dL (ref 8–27)
CALCIUM: 9.4 mg/dL (ref 8.7–10.3)
CHLORIDE: 105 mmol/L (ref 96–106)
CO2: 24 mmol/L (ref 20–29)
Creatinine, Ser: 1.61 mg/dL — ABNORMAL HIGH (ref 0.57–1.00)
GFR, EST AFRICAN AMERICAN: 37 mL/min/{1.73_m2} — AB (ref >59)
GFR, EST NON AFRICAN AMERICAN: 32 mL/min/{1.73_m2} — AB (ref >59)
Glucose: 125 mg/dL — ABNORMAL HIGH (ref 65–99)
POTASSIUM: 4.2 mmol/L (ref 3.5–5.2)
SODIUM: 142 mmol/L (ref 134–144)

## 2018-03-19 LAB — CBC WITH DIFFERENTIAL/PLATELET
BASOS ABS: 0 10*3/uL (ref 0.0–0.2)
BASOS: 0 %
EOS (ABSOLUTE): 0.2 10*3/uL (ref 0.0–0.4)
Eos: 3 %
Hematocrit: 34.2 % (ref 34.0–46.6)
Hemoglobin: 10.9 g/dL — ABNORMAL LOW (ref 11.1–15.9)
IMMATURE GRANS (ABS): 0 10*3/uL (ref 0.0–0.1)
Immature Granulocytes: 0 %
LYMPHS: 21 %
Lymphocytes Absolute: 1.4 10*3/uL (ref 0.7–3.1)
MCH: 28.2 pg (ref 26.6–33.0)
MCHC: 31.9 g/dL (ref 31.5–35.7)
MCV: 89 fL (ref 79–97)
MONOS ABS: 0.6 10*3/uL (ref 0.1–0.9)
Monocytes: 10 %
NEUTROS ABS: 4.4 10*3/uL (ref 1.4–7.0)
Neutrophils: 66 %
PLATELETS: 228 10*3/uL (ref 150–379)
RBC: 3.86 x10E6/uL (ref 3.77–5.28)
RDW: 14.7 % (ref 12.3–15.4)
WBC: 6.6 10*3/uL (ref 3.4–10.8)

## 2018-03-19 LAB — PROTIME-INR
INR: 1.1 (ref 0.8–1.2)
Prothrombin Time: 11.8 s (ref 9.1–12.0)

## 2018-03-19 NOTE — Patient Instructions (Signed)
Medication Instructions:  Your provider recommends that you continue on your current medications as directed. Please refer to the Current Medication list given to you today.    Labwork: TODAY!  Testing/Procedures: Your physician has requested that you have a cardiac catheterization. Cardiac catheterization is used to diagnose and/or treat various heart conditions. Doctors may recommend this procedure for a number of different reasons. The most common reason is to evaluate chest pain. Chest pain can be a symptom of coronary artery disease (CAD), and cardiac catheterization can show whether plaque is narrowing or blocking your heart's arteries. This procedure is also used to evaluate the valves, as well as measure the blood flow and oxygen levels in different parts of your heart. For further information please visit [email or web address] Please follow instruction sheet, as given.  Follow-Up: Leotis ShamesLauren, the TAVR nurse, will contact you with more appointment times.  Any Other Special Instructions Will Be Listed Below (If Applicable). See attached for catheterization instructions.    If you need a refill on your cardiac medications before your next appointment, please call your pharmacy.

## 2018-03-22 ENCOUNTER — Encounter: Payer: Self-pay | Admitting: *Deleted

## 2018-03-23 ENCOUNTER — Telehealth: Payer: Self-pay | Admitting: *Deleted

## 2018-03-23 NOTE — Telephone Encounter (Signed)
Pt [facility]: Wednesday March 24, 2018 1 PM [facility] at: 11 AM Nothing to eat or drink after midnight prior to cath.  Hold: Losartan 03/24/18 Furosemide 03/24/18 Tradjenta 03/24/18  Glipizide 03/24/18   Except hold medications AM meds can be  taken pre-cath with sip of water including: ASA 81 mg Clopidogrel 75 mg  Pt instructed to increase PO water intake today for pre-cath hydration. Confirmed patient has responsible person to drive home post procedure and observe patient for 24 hours: yes

## 2018-03-24 ENCOUNTER — Ambulatory Visit (HOSPITAL_COMMUNITY)
Admission: RE | Admit: 2018-03-24 | Discharge: 2018-03-24 | Disposition: A | Discharge status: Home / Self Care | Payer: Medicare Other | Source: Ambulatory Visit | Attending: Cardiovascular Disease | Admitting: Cardiovascular Disease

## 2018-03-24 ENCOUNTER — Ambulatory Visit (HOSPITAL_COMMUNITY)
Admission: RE | Disposition: A | Discharge status: Home / Self Care | Payer: Self-pay | Source: Ambulatory Visit | Attending: Cardiovascular Disease

## 2018-03-24 ENCOUNTER — Ambulatory Visit (HOSPITAL_BASED_OUTPATIENT_CLINIC_OR_DEPARTMENT_OTHER): Payer: Medicare Other

## 2018-03-24 ENCOUNTER — Encounter (HOSPITAL_COMMUNITY): Payer: Self-pay | Admitting: *Deleted

## 2018-03-24 DIAGNOSIS — N184 Chronic kidney disease, stage 4 (severe): Secondary | ICD-10-CM | POA: Insufficient documentation

## 2018-03-24 DIAGNOSIS — I25810 Atherosclerosis of coronary artery bypass graft(s) without angina pectoris: Secondary | ICD-10-CM | POA: Diagnosis not present

## 2018-03-24 DIAGNOSIS — I340 Nonrheumatic mitral (valve) insufficiency: Secondary | ICD-10-CM | POA: Insufficient documentation

## 2018-03-24 DIAGNOSIS — I361 Nonrheumatic tricuspid (valve) insufficiency: Secondary | ICD-10-CM

## 2018-03-24 DIAGNOSIS — Z951 Presence of aortocoronary bypass graft: Secondary | ICD-10-CM | POA: Diagnosis not present

## 2018-03-24 DIAGNOSIS — E1122 Type 2 diabetes mellitus with diabetic chronic kidney disease: Secondary | ICD-10-CM | POA: Diagnosis not present

## 2018-03-24 DIAGNOSIS — E785 Hyperlipidemia, unspecified: Secondary | ICD-10-CM | POA: Insufficient documentation

## 2018-03-24 DIAGNOSIS — E1151 Type 2 diabetes mellitus with diabetic peripheral angiopathy without gangrene: Secondary | ICD-10-CM | POA: Diagnosis not present

## 2018-03-24 DIAGNOSIS — Z7982 Long term (current) use of aspirin: Secondary | ICD-10-CM | POA: Diagnosis not present

## 2018-03-24 DIAGNOSIS — F17210 Nicotine dependence, cigarettes, uncomplicated: Secondary | ICD-10-CM | POA: Insufficient documentation

## 2018-03-24 DIAGNOSIS — I5032 Chronic diastolic (congestive) heart failure: Secondary | ICD-10-CM | POA: Diagnosis not present

## 2018-03-24 DIAGNOSIS — Z79899 Other long term (current) drug therapy: Secondary | ICD-10-CM | POA: Insufficient documentation

## 2018-03-24 DIAGNOSIS — I350 Nonrheumatic aortic (valve) stenosis: Secondary | ICD-10-CM | POA: Insufficient documentation

## 2018-03-24 DIAGNOSIS — Z880 Allergy status to penicillin: Secondary | ICD-10-CM | POA: Diagnosis not present

## 2018-03-24 DIAGNOSIS — I2582 Chronic total occlusion of coronary artery: Secondary | ICD-10-CM | POA: Diagnosis not present

## 2018-03-24 DIAGNOSIS — I2510 Atherosclerotic heart disease of native coronary artery without angina pectoris: Secondary | ICD-10-CM | POA: Diagnosis not present

## 2018-03-24 DIAGNOSIS — I130 Hypertensive heart and chronic kidney disease with heart failure and stage 1 through stage 4 chronic kidney disease, or unspecified chronic kidney disease: Secondary | ICD-10-CM | POA: Diagnosis not present

## 2018-03-24 DIAGNOSIS — Z7902 Long term (current) use of antithrombotics/antiplatelets: Secondary | ICD-10-CM | POA: Diagnosis not present

## 2018-03-24 HISTORY — PX: LEFT HEART CATH AND CORS/GRAFTS ANGIOGRAPHY: CATH118250

## 2018-03-24 LAB — ECHOCARDIOGRAM COMPLETE
HEIGHTINCHES: 62 in
WEIGHTICAEL: 2624 [oz_av]

## 2018-03-24 LAB — BASIC METABOLIC PANEL
ANION GAP: 11 (ref 5–15)
BUN: 19 mg/dL (ref 6–20)
CHLORIDE: 104 mmol/L (ref 101–111)
CO2: 22 mmol/L (ref 22–32)
Calcium: 9.1 mg/dL (ref 8.9–10.3)
Creatinine, Ser: 1.54 mg/dL — ABNORMAL HIGH (ref 0.44–1.00)
GFR calc Af Amer: 38 mL/min — ABNORMAL LOW (ref >60)
GFR, EST NON AFRICAN AMERICAN: 33 mL/min — AB (ref >60)
Glucose, Bld: 130 mg/dL — ABNORMAL HIGH (ref 65–99)
POTASSIUM: 4.1 mmol/L (ref 3.5–5.1)
SODIUM: 137 mmol/L (ref 135–145)

## 2018-03-24 LAB — GLUCOSE, CAPILLARY: Glucose-Capillary: 144 mg/dL — ABNORMAL HIGH (ref 65–99)

## 2018-03-24 SURGERY — LEFT HEART CATH AND CORS/GRAFTS ANGIOGRAPHY
Anesthesia: LOCAL

## 2018-03-24 MED ORDER — IOPAMIDOL (ISOVUE-370) INJECTION 76%
INTRAVENOUS | Status: DC | PRN
  Administered 2018-03-24: 100 mL via INTRA_ARTERIAL

## 2018-03-24 MED ORDER — SODIUM CHLORIDE 0.9% FLUSH: 3.0000 mL | INTRAVENOUS | Status: DC | PRN

## 2018-03-24 MED ORDER — FENTANYL CITRATE (PF) 100 MCG/2ML IJ SOLN
INTRAMUSCULAR | Status: DC | PRN
  Administered 2018-03-24: 25 ug via INTRAVENOUS

## 2018-03-24 MED ORDER — IOPAMIDOL (ISOVUE-370) INJECTION 76%
INTRAVENOUS | Status: AC
  Filled 2018-03-24: qty 125

## 2018-03-24 MED ORDER — ASPIRIN 81 MG PO CHEW: 81.0000 mg | CHEWABLE_TABLET | ORAL | Status: DC

## 2018-03-24 MED ORDER — SODIUM CHLORIDE 0.9 % WEIGHT BASED INFUSION: 1.0000 mL/kg/h | INTRAVENOUS | Status: DC

## 2018-03-24 MED ORDER — MIDAZOLAM HCL 2 MG/2ML IJ SOLN
INTRAMUSCULAR | Status: AC
  Filled 2018-03-24: qty 2

## 2018-03-24 MED ORDER — SODIUM CHLORIDE 0.9 % WEIGHT BASED INFUSION
3.0000 mL/kg/h | INTRAVENOUS | Status: AC
  Administered 2018-03-24: 3 mL/kg/h via INTRAVENOUS

## 2018-03-24 MED ORDER — SODIUM CHLORIDE 0.9% FLUSH: 3.0000 mL | Freq: Two times a day (BID) | INTRAVENOUS | Status: DC

## 2018-03-24 MED ORDER — NITROGLYCERIN 1 MG/10 ML FOR IR/CATH LAB
INTRA_ARTERIAL | Status: DC | PRN
  Administered 2018-03-24: 200 ug via INTRAVENOUS

## 2018-03-24 MED ORDER — CLOPIDOGREL BISULFATE 75 MG PO TABS: 75.0000 mg | ORAL_TABLET | ORAL | Status: DC

## 2018-03-24 MED ORDER — MIDAZOLAM HCL 2 MG/2ML IJ SOLN
INTRAMUSCULAR | Status: DC | PRN
  Administered 2018-03-24: 1 mg via INTRAVENOUS

## 2018-03-24 MED ORDER — ONDANSETRON HCL 4 MG/2ML IJ SOLN
4.0000 mg | Freq: Four times a day (QID) | INTRAMUSCULAR | Status: DC | PRN
Start: 2018-03-24 — End: 2018-03-24

## 2018-03-24 MED ORDER — FENTANYL CITRATE (PF) 100 MCG/2ML IJ SOLN
INTRAMUSCULAR | Status: AC
  Filled 2018-03-24: qty 2

## 2018-03-24 MED ORDER — HEPARIN SODIUM (PORCINE) 1000 UNIT/ML IJ SOLN
INTRAMUSCULAR | Status: DC | PRN
  Administered 2018-03-24: 4000 [IU] via INTRAVENOUS

## 2018-03-24 MED ORDER — HEPARIN (PORCINE) IN NACL 2-0.9 UNIT/ML-% IJ SOLN
INTRAMUSCULAR | Status: AC | PRN
  Administered 2018-03-24 (×2): 500 mL via INTRA_ARTERIAL

## 2018-03-24 MED ORDER — ACETAMINOPHEN 325 MG PO TABS: 650.0000 mg | ORAL_TABLET | ORAL | Status: DC | PRN

## 2018-03-24 MED ORDER — HEPARIN (PORCINE) IN NACL 2-0.9 UNIT/ML-% IJ SOLN
INTRAMUSCULAR | Status: AC
  Filled 2018-03-24: qty 1000

## 2018-03-24 MED ORDER — SODIUM CHLORIDE 0.9 % IV SOLN: 250.0000 mL | INTRAVENOUS | Status: DC | PRN

## 2018-03-24 MED ORDER — VERAPAMIL HCL 2.5 MG/ML IV SOLN
INTRAVENOUS | Status: DC | PRN
  Administered 2018-03-24: 10 mL via INTRA_ARTERIAL

## 2018-03-24 MED ORDER — NITROGLYCERIN 1 MG/10 ML FOR IR/CATH LAB
INTRA_ARTERIAL | Status: AC
  Filled 2018-03-24: qty 10

## 2018-03-24 MED ORDER — LIDOCAINE HCL 1 % IJ SOLN
INTRAMUSCULAR | Status: AC
  Filled 2018-03-24: qty 20

## 2018-03-24 MED ORDER — LIDOCAINE HCL (PF) 1 % IJ SOLN
INTRAMUSCULAR | Status: DC | PRN
  Administered 2018-03-24 (×2): 2 mL via INTRADERMAL

## 2018-03-24 SURGICAL SUPPLY — 16 items
BAND ZEPHYR COMPRESS 30 LONG (HEMOSTASIS) ×2 IMPLANT
CATH BALLN WEDGE 5F 110CM (CATHETERS) ×2 IMPLANT
CATH INFINITI 5FR AL1 (CATHETERS) ×2 IMPLANT
CATH INFINITI 5FR MULTPACK ANG (CATHETERS) ×2 IMPLANT
GUIDEWIRE .025 260CM (WIRE) ×2 IMPLANT
GUIDEWIRE INQWIRE 1.5J.035X260 (WIRE) ×1 IMPLANT
INQWIRE 1.5J .035X260CM (WIRE) ×2
KIT HEART LEFT (KITS) ×2 IMPLANT
NEEDLE PERC 21GX4CM (NEEDLE) ×2 IMPLANT
PACK CARDIAC CATHETERIZATION (CUSTOM PROCEDURE TRAY) ×2 IMPLANT
SHEATH RAIN 4/5FR (SHEATH) ×2 IMPLANT
SHEATH RAIN RADIAL 21G 6FR (SHEATH) ×2 IMPLANT
SYR MEDRAD MARK V 150ML (SYRINGE) ×2 IMPLANT
TRANSDUCER W/STOPCOCK (MISCELLANEOUS) ×2 IMPLANT
TUBING CIL FLEX 10 FLL-RA (TUBING) ×2 IMPLANT
WIRE HI TORQ VERSACORE-J 145CM (WIRE) ×2 IMPLANT

## 2018-03-24 NOTE — Discharge Instructions (Signed)
**Note Sydney Ingram-identified via Obfuscation** Radial Site Care °Refer to this sheet in the next few weeks. These instructions provide you with information about caring for yourself after your procedure. Your health care provider may also give you more specific instructions. Your treatment has been planned according to current medical practices, but problems sometimes occur. Call your health care provider if you have any problems or questions after your procedure. °What can I expect after the procedure? °After your procedure, it is typical to have the following: °· Bruising at the radial site that usually fades within 1-2 weeks. °· Blood collecting in the tissue (hematoma) that may be painful to the touch. It should usually decrease in size and tenderness within 1-2 weeks. ° °Follow these instructions at home: °· Take medicines only as directed by your health care provider. °· You may shower 24-48 hours after the procedure or as directed by your health care provider. Remove the bandage (dressing) and gently wash the site with plain soap and water. Pat the area dry with a clean towel. Do not rub the site, because this may cause bleeding. °· Do not take baths, swim, or use a hot tub until your health care provider approves. °· Check your insertion site every day for redness, swelling, or drainage. °· Do not apply powder or lotion to the site. °· Do not flex or bend the affected arm for 24 hours or as directed by your health care provider. °· Do not push or pull heavy objects with the affected arm for 24 hours or as directed by your health care provider. °· Do not lift over 10 lb (4.5 kg) for 5 days after your procedure or as directed by your health care provider. °· Ask your health care provider when it is okay to: °? Return to work or school. °? Resume usual physical activities or sports. °? Resume sexual activity. °· Do not drive home if you are discharged the same day as the procedure. Have someone else drive you. °· You may drive 24 hours after the procedure  unless otherwise instructed by your health care provider. °· Do not operate machinery or power tools for 24 hours after the procedure. °· If your procedure was done as an outpatient procedure, which means that you went home the same day as your procedure, a responsible adult should be with you for the first 24 hours after you arrive home. °· Keep all follow-up visits as directed by your health care provider. This is important. °Contact a health care provider if: °· You have a fever. °· You have chills. °· You have increased bleeding from the radial site. Hold pressure on the site. °Get help right away if: °· You have unusual pain at the radial site. °· You have redness, warmth, or swelling at the radial site. °· You have drainage (other than a small amount of blood on the dressing) from the radial site. °· The radial site is bleeding, and the bleeding does not stop after 30 minutes of holding steady pressure on the site. °· Your arm or hand becomes pale, cool, tingly, or numb. °This information is not intended to replace advice given to you by your health care provider. Make sure you discuss any questions you have with your health care provider. °Document Released: 01/03/2011 Document Revised: 05/08/2016 Document Reviewed: 06/19/2014 °Elsevier Interactive Patient Education © 2018 [employer] ° ° ° °Moderate Conscious Sedation, Adult, Care After °These instructions provide you with information about caring for yourself after your procedure. Your health care provider  **Note Sydney Ingram-identified via Obfuscation** may also give you more specific instructions. Your treatment has been planned according to current medical practices, but problems sometimes occur. Call your health care provider if you have any problems or questions after your procedure. °What can I expect after the procedure? °After your procedure, it is common: °· To feel sleepy for several hours. °· To feel clumsy and have poor balance for several hours. °· To have poor judgment for several  hours. °· To vomit if you eat too soon. ° °Follow these instructions at home: °For at least 24 hours after the procedure: ° °· Do not: °? Participate in activities where you could fall or become injured. °? Drive. °? Use heavy machinery. °? Drink alcohol. °? Take sleeping pills or medicines that cause drowsiness. °? Make important decisions or sign legal documents. °? Take care of children on your own. °· Rest. °Eating and drinking °· Follow the diet recommended by your health care provider. °· If you vomit: °? Drink water, juice, or soup when you can drink without vomiting. °? Make sure you have little or no nausea before eating solid foods. °General instructions °· Have a responsible adult stay with you until you are awake and alert. °· Take over-the-counter and prescription medicines only as told by your health care provider. °· If you smoke, do not smoke without supervision. °· Keep all follow-up visits as told by your health care provider. This is important. °Contact a health care provider if: °· You keep feeling nauseous or you keep vomiting. °· You feel light-headed. °· You develop a rash. °· You have a fever. °Get help right away if: °· You have trouble breathing. °This information is not intended to replace advice given to you by your health care provider. Make sure you discuss any questions you have with your health care provider. °Document Released: 09/21/2013 Document Revised: 05/05/2016 Document Reviewed: 03/22/2016 °Elsevier Interactive Patient Education © 2018 [employer] ° °

## 2018-03-24 NOTE — Progress Notes (Signed)
  Echocardiogram 2D Echocardiogram has been performed.  Tye SavoyCasey N Chelsea Nusz 03/24/2018, 3:50 PM

## 2018-03-24 NOTE — Interval H&P Note (Signed)
History and Physical Interval Note:  03/24/2018 1:12 PM  Sydney Ingram  has presented today for surgery, with the diagnosis of Aortic Stenosis/CAD  The various methods of treatment have been discussed with the patient and family. After consideration of risks, benefits and other options for treatment, the patient has consented to  Procedure(s): RIGHT/LEFT HEART CATH AND CORONARY/GRAFT ANGIOGRAPHY (N/A) as a surgical intervention .  The patient's history has been reviewed, patient examined, no change in status, stable for surgery.  I have reviewed the patient's chart and labs.  Questions were answered to the patient's satisfaction.     Tonny BollmanMichael Lauretta Sallas

## 2018-03-24 NOTE — Progress Notes (Signed)
Zephyr BAND REMOVAL  LOCATION:    left radial  DEFLATED PER PROTOCOL:    Yes.    TIME BAND OFF / DRESSING APPLIED:    1645   SITE UPON ARRIVAL:    Level 0  SITE AFTER BAND REMOVAL:    Level 0  CIRCULATION SENSATION AND MOVEMENT:    Within Normal Limits   Yes.    COMMENTS:   ZEPHYR BAND REMOVED/ TEGADERM DSG APPLIED

## 2018-03-25 ENCOUNTER — Other Ambulatory Visit: Payer: Self-pay

## 2018-03-25 ENCOUNTER — Encounter (HOSPITAL_COMMUNITY): Payer: Medicare Other

## 2018-03-25 ENCOUNTER — Encounter (HOSPITAL_COMMUNITY): Payer: Self-pay | Admitting: Cardiovascular Disease

## 2018-03-25 DIAGNOSIS — N289 Disorder of kidney and ureter, unspecified: Secondary | ICD-10-CM

## 2018-03-25 DIAGNOSIS — I350 Nonrheumatic aortic (valve) stenosis: Secondary | ICD-10-CM

## 2018-03-25 MED FILL — Lidocaine HCl Local Inj 1%: INTRAMUSCULAR | Qty: 20 | Status: AC

## 2018-03-25 MED FILL — Heparin Sodium (Porcine) 2 Unit/ML in Sodium Chloride 0.9%: INTRAMUSCULAR | Qty: 1000 | Status: AC

## 2018-03-31 ENCOUNTER — Ambulatory Visit (HOSPITAL_COMMUNITY)
Admission: RE | Admit: 2018-03-31 | Discharge: 2018-03-31 | Disposition: A | Discharge status: Home / Self Care | Payer: Medicare Other | Source: Ambulatory Visit | Attending: Cardiovascular Disease | Admitting: Cardiovascular Disease

## 2018-03-31 ENCOUNTER — Encounter (HOSPITAL_COMMUNITY): Payer: Self-pay

## 2018-03-31 ENCOUNTER — Ambulatory Visit (HOSPITAL_BASED_OUTPATIENT_CLINIC_OR_DEPARTMENT_OTHER)
Admission: RE | Admit: 2018-03-31 | Discharge: 2018-03-31 | Disposition: A | Discharge status: Home / Self Care | Payer: Medicare Other | Source: Ambulatory Visit | Attending: Cardiovascular Disease | Admitting: Cardiovascular Disease

## 2018-03-31 ENCOUNTER — Ambulatory Visit (HOSPITAL_COMMUNITY): Payer: Medicare Other

## 2018-03-31 DIAGNOSIS — J449 Chronic obstructive pulmonary disease, unspecified: Secondary | ICD-10-CM | POA: Diagnosis not present

## 2018-03-31 DIAGNOSIS — I350 Nonrheumatic aortic (valve) stenosis: Secondary | ICD-10-CM

## 2018-03-31 DIAGNOSIS — I289 Disease of pulmonary vessels, unspecified: Secondary | ICD-10-CM | POA: Insufficient documentation

## 2018-03-31 DIAGNOSIS — K8020 Calculus of gallbladder without cholecystitis without obstruction: Secondary | ICD-10-CM | POA: Diagnosis not present

## 2018-03-31 DIAGNOSIS — K769 Liver disease, unspecified: Secondary | ICD-10-CM | POA: Insufficient documentation

## 2018-03-31 DIAGNOSIS — I700 Atherosclerosis of aorta: Secondary | ICD-10-CM | POA: Diagnosis not present

## 2018-03-31 DIAGNOSIS — I6523 Occlusion and stenosis of bilateral carotid arteries: Secondary | ICD-10-CM | POA: Insufficient documentation

## 2018-03-31 DIAGNOSIS — I7789 Other specified disorders of arteries and arterioles: Secondary | ICD-10-CM | POA: Diagnosis not present

## 2018-03-31 DIAGNOSIS — I2510 Atherosclerotic heart disease of native coronary artery without angina pectoris: Secondary | ICD-10-CM | POA: Diagnosis not present

## 2018-03-31 DIAGNOSIS — N289 Disorder of kidney and ureter, unspecified: Secondary | ICD-10-CM | POA: Insufficient documentation

## 2018-03-31 LAB — PULMONARY FUNCTION TEST
DL/VA % pred: 97 %
DL/VA: 4.43 ml/min/mmHg/L
DLCO UNC % PRED: 61 %
DLCO UNC: 13.24 ml/min/mmHg
FEF 25-75 Post: 2.13 L/sec
FEF 25-75 Pre: 1.35 L/sec
FEF2575-%Change-Post: 58 %
FEF2575-%PRED-POST: 124 %
FEF2575-%Pred-Pre: 78 %
FEV1-%Change-Post: 1 %
FEV1-%PRED-PRE: 84 %
FEV1-%Pred-Post: 85 %
FEV1-Post: 1.74 L
FEV1-Pre: 1.72 L
FEV1FVC-%Change-Post: 0 %
FEV1FVC-%Pred-Pre: 106 %
FEV6-%CHANGE-POST: 1 %
FEV6-%Pred-Post: 83 %
FEV6-%Pred-Pre: 82 %
FEV6-POST: 2.15 L
FEV6-Pre: 2.11 L
FEV6FVC-%PRED-POST: 105 %
FEV6FVC-%Pred-Pre: 105 %
FVC-%Change-Post: 1 %
FVC-%PRED-POST: 79 %
FVC-%PRED-PRE: 78 %
FVC-POST: 2.15 L
FVC-PRE: 2.11 L
Post FEV1/FVC ratio: 81 %
Post FEV6/FVC ratio: 100 %
Pre FEV1/FVC ratio: 81 %
Pre FEV6/FVC Ratio: 100 %
RV % pred: 213 %
RV: 4.53 L
TLC % pred: 137 %
TLC: 6.52 L

## 2018-03-31 MED ORDER — SODIUM BICARBONATE BOLUS VIA INFUSION
INTRAVENOUS | Status: AC
  Administered 2018-03-31: 225 meq via INTRAVENOUS
  Filled 2018-03-31: qty 1

## 2018-03-31 MED ORDER — SODIUM BICARBONATE 8.4 % IV SOLN
INTRAVENOUS | Status: DC
  Filled 2018-03-31: qty 500

## 2018-03-31 MED ORDER — IOPAMIDOL (ISOVUE-370) INJECTION 76%
INTRAVENOUS | Status: AC
  Administered 2018-03-31: 100 mL
  Filled 2018-03-31: qty 100

## 2018-03-31 MED ORDER — ALBUTEROL SULFATE (2.5 MG/3ML) 0.083% IN NEBU
2.5000 mg | INHALATION_SOLUTION | Freq: Once | RESPIRATORY_TRACT | Status: AC
  Administered 2018-03-31: 2.5 mg via RESPIRATORY_TRACT

## 2018-03-31 MED ORDER — IOPAMIDOL (ISOVUE-370) INJECTION 76%
100.0000 mL | Freq: Once | INTRAVENOUS | Status: AC | PRN
  Administered 2018-03-31: 100 mL via INTRAVENOUS

## 2018-03-31 NOTE — Progress Notes (Signed)
*  PRELIMINARY RESULTS* Vascular Ultrasound Carotid Duplex (Doppler) has been completed.  Findings are near-normal with minimal plaque formation in bilateral internal carotid arteries. Vertebral arteries are patent with antegrade flow.  03/31/2018 11:25 AM Gertie Fey, BS, RVT, RDCS, RDMS

## 2018-04-01 ENCOUNTER — Other Ambulatory Visit: Payer: Self-pay

## 2018-04-21 ENCOUNTER — Encounter: Payer: Self-pay | Admitting: Physical Therapy

## 2018-04-21 ENCOUNTER — Institutional Professional Consult (permissible substitution) (INDEPENDENT_AMBULATORY_CARE_PROVIDER_SITE_OTHER): Payer: Medicare Other | Admitting: Surgery

## 2018-04-21 ENCOUNTER — Other Ambulatory Visit: Payer: Self-pay

## 2018-04-21 ENCOUNTER — Ambulatory Visit: Payer: Medicare Other | Attending: Cardiovascular Disease | Admitting: Physical Therapy

## 2018-04-21 ENCOUNTER — Encounter: Payer: Self-pay | Admitting: Surgery

## 2018-04-21 VITALS — BP 143/71 | HR 65 | Resp 18 | Ht 63.0 in | Wt 165.2 lb

## 2018-04-21 DIAGNOSIS — I350 Nonrheumatic aortic (valve) stenosis: Secondary | ICD-10-CM

## 2018-04-21 DIAGNOSIS — R293 Abnormal posture: Secondary | ICD-10-CM | POA: Insufficient documentation

## 2018-04-21 NOTE — Therapy (Signed)
[facility] Outpatient Rehabilitation [facility] [address] [place], [place], [address] Phone: [phone]   Fax:  [phone]  Physical Therapy Evaluation  Patient Details  Name: Sydney Ingram MRN: [id number] Date of Birth: 1946/02/04 Referring Provider: Tonny Bollman MD   Encounter Date: 04/21/2018  PT End of Session - 04/21/18 1231    Visit Number  1    Number of Visits  1    Date for PT Re-Evaluation  04/21/18    PT Start Time  1146    PT Stop Time  1228    PT Time Calculation (min)  42 min    Equipment Utilized During Treatment  Gait belt    Activity Tolerance  Patient tolerated treatment well    Behavior During Therapy  [facility] for tasks assessed/performed       Past Medical History:  Diagnosis Date  . Aortic stenosis   . CAD (coronary artery disease)   . Cardiomegaly   . Chronic diastolic (congestive) heart failure (HCC)   . Chronic kidney disease (CKD), stage III (moderate) (HCC)   . CKD (chronic kidney disease)    stage4  . Degenerative lumbar disc   . Diabetes type 2, controlled (HCC)   . Essential hypertension   . GERD (gastroesophageal reflux disease)   . High risk medication use   . Hyperlipidemia   . Hyperlipidemia   . Insomnia   . Menopause   . Mitral regurgitation   . PAD (peripheral artery disease) (HCC)   . Palpitation   . Pulmonary hypertension (HCC)   . Sciatica   . Thyroid nodule   . Type II diabetes mellitus (HCC)     Past Surgical History:  Procedure Laterality Date  . CORONARY ARTERY BYPASS GRAFT  2009   CABG x3 - [facility]  . LEFT HEART CATH AND CORS/GRAFTS ANGIOGRAPHY N/A 03/24/2018   Procedure: LEFT HEART CATH AND CORS/GRAFTS ANGIOGRAPHY;  Surgeon: Tonny Bollman, MD;  Location: [facility] INVASIVE CV LAB;  Service: Cardiovascular;  Laterality: N/A;  . PARTIAL HYSTERECTOMY    . PERCUTANEOUS CORONARY STENT INTERVENTION (PCI-S)  2008   details unclear    There were no vitals filed for this  visit.   Subjective Assessment - 04/21/18 1155    Subjective  pt is a 72 y.o F with CC of SOB that has been going on for a long time since she had heart surgery in 2009 in [place]. pt reports hx of intermittent sciatica but is unsure of what leg seems to bother her but she is doing well today.     Limitations  Walking    How long can you sit comfortably?  unlimited    How long can you stand comfortably?  10-20 min    How long can you walk comfortably?  10-20 min     Patient Stated Goals  to get heart better    Currently in Pain?  No/denies         [facility] PT Assessment - 04/21/18 1200      ROM / Strength   AROM / PROM / Strength  AROM;Strength      AROM   Overall AROM   Within functional limits for tasks performed      Strength   Overall Strength  Within functional limits for tasks performed    Strength Assessment Site  Hand    Right Hand Grip (lbs)  49    Left Hand Grip (lbs)  54  [facility] Pre-Surgical Assessment - 04/21/18 1200    5 Meter Walk Test- trial 1  5 sec    5 Meter Walk Test- trial 2  5 sec.     5 Meter Walk Test- trial 3  4 sec.    5 meter walk test average  4.67 sec    4 Stage Balance Test tolerated for:   5 sec.    4 Stage Balance Test Position  3    Sit To Stand Test- trial 1  14 sec.    ADL/IADL Independent with:  Bathing;Dressing;Meal prep;Finances;Yard work    ADL/IADL Freight forwarder Index  Midly frail    6 Minute Walk- Baseline  yes    BP (mmHg)  148/64    HR (bpm)  66    02 Sat (%RA)  98 %    Modified Borg Scale for Dyspnea  0- Nothing at all    Perceived Rate of Exertion (Borg)  6-    6 Minute Walk Post Test  yes    BP (mmHg)  151/115  (Abnormal)  reassessed 5 min post 158/80    HR (bpm)  88    02 Sat (%RA)  99 %    Modified Borg Scale for Dyspnea  1- Very mild shortness of breath    Perceived Rate of Exertion (Borg)  13- Somewhat hard    Aerobic Endurance Distance Walked  1260    Endurance additional comments  pt exhibits 18.45% disability  compared to age related norm              Objective measurements completed on examination: See above findings.                           Plan - 04/21/18 1232    Clinical Impression Statement  See assessment in note    Clinical Presentation  Stable    Clinical Decision Making  Low    Rehab Potential  Good    PT Frequency  One time visit    PT Next Visit Plan  pre-TAVR evaluation    Consulted and Agree with Plan of Care  Patient           Clinical Impression Statement: Pt is a 72 yo F presenting to OP PT for evaluation prior to possible TAVR surgery due to severe aortic stenosis. Pt reports onset of since she had heart surgery in 2009. Symptoms are limiting endurance. Pt presents with functional ROM and strength,fair balance and is mild at high fall risk 4 stage balance test, good  walking speed and good aerobic endurance per 6 minute walk test. Pt ambulated 1260 feet with 6 minute walk, pt reported 1/10 shortness of breath on modified scale for dyspnea. Perceived exertion and diastolic BPR increased significantly with 6 minute walk test. Based on the Short Physical Performance Battery, patient has a frailty rating of 9/12 with </= 5/12 considered frail.    Patient demonstrated the following deficits and impairments:     Visit Diagnosis: Abnormal posture     Problem List Patient Active Problem List   Diagnosis Date Noted  . Aortic stenosis   . Pulmonary hypertension (HCC)   . Chronic kidney disease (CKD), stage III (moderate) (HCC)   . Essential hypertension   . Hyperlipidemia   . Type II diabetes mellitus (HCC)   . Chronic diastolic (congestive) heart failure (HCC)    Audrina Marten PT, DPT, LAT, ATC  04/21/18  12:36 PM  [facility] Outpatient Rehabilitation [facility] [address] [place], [place], [address] Phone: [phone]   Fax:  [phone]  Name: Sydney Ingram MRN: [id number] Date of Birth:  Oct 03, 1946

## 2018-04-23 ENCOUNTER — Encounter: Payer: Self-pay | Admitting: Surgery

## 2018-04-23 NOTE — Progress Notes (Signed)
Patient ID: Sydney Ingram, female   DOB: 10/09/1946, 72 y.o.   MRN: [id number]  [facility]  MULTIDISCIPLINARY HEART VALVE CLINIC  CARDIOTHORACIC SURGERY CONSULTATION REPORT  Referring Provider is Rosario, Raymond T, MD PCP is Rosario, Raymond T, MD  Chief Complaint  Patient presents with  . Aortic Stenosis    2nd TAVR consultation    HPI:  The patient is a 72 year old woman with a history of hypertension, hyperlipidemia, type 2 DM, stage lll-lV chronic kidney disease, coronary artery disease s/p PCI and stenting about 12 years ago in [place] followed by restenosis and CABG at [facility] in 2009, aortic stenosis and chronic diastolic heart failure. She moved to [place] about 5 years ago and has been followed by Dr. Rosario. Serial echocardiograms have shown progressive aortic stenosis and her most recent done by Dr. Rosario on 03/04/2018 showed a mean transvalvular gradient of 45 mm Hg, AVA of 0.6 cm2 and an EF of 55-60%. She had a repeat echo here on 03/24/2018 which showed a possible bicuspid aortic valve that was severely calcified and thickened with restricted mobility.  The mean aortic valve gradient was 47 mmHg.  The dimensionless index was 0.22.  Left ventricular ejection fraction was reduced to 30 to 35% with diffuse hypokinesis with akinesis of the basal and mid inferior and inferolateral walls.  There is grade 2 diastolic dysfunction.  Cardiac catheterization on 03/24/2018 showed severe native three-vessel coronary disease with total occlusion of the right coronary artery, severe left main stenosis, severe ramus stenosis, and severe proximal LAD stenosis.  There is continued patency of the left internal mammary graft to the LAD with total occlusion of the saphenous vein grafts.  The mean aortic valve gradient was 20 mmHg with a peak gradient of 50 mmHg.  The LVEDP was 18 mm Hg.  The patient is here today with her 2 sisters whom she lives with.  She reports that she has  been fairly [place].  She is to get out and walk but has not been doing that over the past couple years.  Her main activity is singing in her church choir and her and her daughters have noted that she gets short of breath after singing 1 or 2 songs.  She has no limitations walking around her house.  She has had no symptoms at rest.  She denies orthopnea and PND.  She has had some lower extremity edema which is improved since being on diuretics.  She denies any chest pain or pressure or tightness.  She has had no dizziness or syncope.  Past Medical History:  Diagnosis Date  . Aortic stenosis   . CAD (coronary artery disease)   . Cardiomegaly   . Chronic diastolic (congestive) heart failure (HCC)   . Chronic kidney disease (CKD), stage III (moderate) (HCC)   . CKD (chronic kidney disease)    stage4  . Degenerative lumbar disc   . Diabetes type 2, controlled (HCC)   . Essential hypertension   . GERD (gastroesophageal reflux disease)   . High risk medication use   . Hyperlipidemia   . Hyperlipidemia   . Insomnia   . Menopause   . Mitral regurgitation   . PAD (peripheral artery disease) (HCC)   . Palpitation   . Pulmonary hypertension (HCC)   . Sciatica   . Thyroid nodule   . Type II diabetes mellitus (HCC)     Past Surgical History:  Procedure Laterality Date  .   CORONARY ARTERY BYPASS GRAFT  2009   CABG x3 - [facility] [place]  . LEFT HEART CATH AND CORS/GRAFTS ANGIOGRAPHY N/A 03/24/2018   Procedure: LEFT HEART CATH AND CORS/GRAFTS ANGIOGRAPHY;  Surgeon: Cooper, Michael, MD;  Location: [facility] INVASIVE CV LAB;  Service: Cardiovascular;  Laterality: N/A;  . PARTIAL HYSTERECTOMY    . PERCUTANEOUS CORONARY STENT INTERVENTION (PCI-S)  2008   details unclear    Family History  Problem Relation Age of Onset  . Diabetes Mother   . Diabetes Sister   . Hyperlipidemia Sister   . Heart disease Brother     Social History   Socioeconomic History  . Marital status: Legally Separated     Spouse name: Not on file  . Number of children: Not on file  . Years of education: Not on file  . Highest education level: Not on file  Occupational History  . Not on file  Social Needs  . Financial resource strain: Not on file  . Food insecurity:    Worry: Not on file    Inability: Not on file  . Transportation needs:    Medical: Not on file    Non-medical: Not on file  Tobacco Use  . Smoking status: Never Smoker  . Smokeless tobacco: Never Used  Substance and Sexual Activity  . Alcohol use: Not Currently    Frequency: Never  . Drug use: Never  . Sexual activity: Not Currently  Lifestyle  . Physical activity:    Days per week: Not on file    Minutes per session: Not on file  . Stress: Not on file  Relationships  . Social connections:    Talks on phone: Not on file    Gets together: Not on file    Attends religious service: Not on file    Active member of club or organization: Not on file    Attends meetings of clubs or organizations: Not on file    Relationship status: Not on file  . Intimate partner violence:    Fear of current or ex partner: Not on file    Emotionally abused: Not on file    Physically abused: Not on file    Forced sexual activity: Not on file  Other Topics Concern  . Not on file  Social History Narrative  . Not on file    Current Outpatient Medications  Medication Sig Dispense Refill  . amLODipine (NORVASC) 10 MG tablet Take 10 mg by mouth daily.    . aspirin EC 81 MG tablet Take 81 mg by mouth daily.    . atorvastatin (LIPITOR) 20 MG tablet Take 20 mg by mouth daily.    . Biotin w/ Vitamins C & E (HAIR/SKIN/NAILS PO) Take 1 tablet by mouth daily.    . carvedilol (COREG) 25 MG tablet Take 25 mg by mouth 2 (two) times daily with a meal.    . clopidogrel (PLAVIX) 75 MG tablet Take 75 mg by mouth daily.    . co-enzyme Q-10 30 MG capsule Take 30 mg by mouth daily.    . furosemide (LASIX) 20 MG tablet Take 20 mg by mouth every other day.    .  glipiZIDE (GLUCOTROL XL) 10 MG 24 hr tablet Take 10 mg by mouth daily with breakfast.    . linagliptin (TRADJENTA) 5 MG TABS tablet Take 5 mg by mouth daily.    . losartan (COZAAR) 50 MG tablet Take 50 mg by mouth daily.    . Multiple Vitamin (  MULTIVITAMIN) tablet Take 1 tablet by mouth daily.    . omeprazole (PRILOSEC) 20 MG capsule Take 20 mg by mouth daily.    . Propylene Glycol (SYSTANE BALANCE) 0.6 % SOLN Place 1 drop into both eyes 2 (two) times daily as needed (eye irritation).     No current facility-administered medications for this visit.     Allergies  Allergen Reactions  . Penicillins Other (See Comments)    Yeast infection      Review of Systems:   General:  decreased appetite, decreased energy, no weight gain, + weight loss, no fever  Cardiac:  no chest pain with exertion, no chest pain at rest, + SOB with moderate exertion, no resting SOB, no PND, no orthopnea, occasional palpitations, no arrhythmia, no atrial fibrillation, mild LE edema, no dizzy spells, no syncope  Respiratory:  exertional shortness of breath, no home oxygen, no productive cough, no dry cough, no bronchitis, no wheezing, no hemoptysis, no asthma, no pain with inspiration or cough, no sleep apnea, no CPAP at night  GI:   no difficulty swallowing, no reflux, no frequent heartburn, no hiatal hernia, no abdominal pain, no constipation, no diarrhea, no hematochezia, no hematemesis, no melena  GU:   no dysuria,  no frequency, no urinary tract infection, no hematuria,  no kidney stones, + kidney disease  Vascular:  no pain suggestive of claudication, + pain in feet, no leg cramps, no varicose veins, no DVT, no non-healing foot ulcer  Neuro:   no stroke, no TIA's, no seizures, no headaches, no temporary blindness one eye,  no slurred speech, no peripheral neuropathy, no chronic pain, no instability of gait, mild memory/cognitive dysfunction  Musculoskeletal: + arthritis, no joint swelling, no myalgias, mild  difficulty walking, normal mobility   Skin:   no rash, no itching, no skin infections, no pressure sores or ulcerations  Psych:   no anxiety, no depression, no nervousness, no unusual recent stress  Eyes:   no blurry vision, no floaters, no recent vision changes, does not wear glasses or contacts  ENT:   no hearing loss, no loose or painful teeth, + dentures, last saw dentist 6 months ago  Hematologic:  no easy bruising, no abnormal bleeding, no clotting disorder, no frequent epistaxis  Endocrine:  + diabetes, does not check CBG's at home, + hx of thyroid nodules that have been followed with ultrasound.       Physical Exam:   BP (!) 143/71 (BP Location: Right Arm, Patient Position: Sitting, Cuff Size: Normal)   Pulse 65   Resp 18   Ht 5' 3" (1.6 m)   Wt 165 lb 3.2 oz (74.9 kg)   SpO2 98% Comment: RA  BMI 29.26 kg/m   General:  Obese but  well-appearing  HEENT:  Unremarkable, NCAT, PERLA, EOMI, oropharynx clear  Neck:   no JVD, no bruits, no adenopathy   Chest:   clear to auscultation, symmetrical breath sounds, no wheezes, no rhonchi   CV:   RRR, grade III/VI crescendo/decrescendo murmur heard best at RSB,  no diastolic murmur  Abdomen:  soft, non-tender, no masses or organomegaly  Extremities:  warm, well-perfused, pulses diminished, trace LE edema in ankles  Rectal/GU  Deferred  Neuro:   Grossly non-focal and symmetrical throughout  Skin:   Clean and dry, no rashes, no breakdown   Diagnostic Tests:      *[facility]*                   *  [facility]*                         [address]                        [place], [place] [address]                            [phone]  ------------------------------------------------------------------- Transthoracic Echocardiography  Patient:    Sciarra, Britanny D MR #:       [id number] Study Date: 03/24/2018 Gender:     F Age:        71 Height:     157.5 cm Weight:     74.4 kg BSA:        1.83 m^2 Pt.  Status: Room:   ADMITTING    Michael Cooper, MD  ATTENDING    Michael Cooper, MD  ORDERING     Michael Cooper, MD  REFERRING    Michael Cooper, MD  PERFORMING   [facility], Inpatient  SONOGRAPHER  Casey Kirkpatrick  cc:  ------------------------------------------------------------------- LV EF: 30% -   35%  ------------------------------------------------------------------- Indications:      Aortic stenosis 424.1.  ------------------------------------------------------------------- History:   PMH:   Coronary artery disease.  Risk factors:  Current tobacco use. Dyslipidemia.  ------------------------------------------------------------------- Study Conclusions  - Left ventricle: The cavity size was normal. There was moderate   concentric hypertrophy. Systolic function was moderately to   severely reduced. The estimated ejection fraction was in the   range of 30% to 35%. Diffuse hypokinesis with akinesis of the   basal and mid inferior and inferolateral walls. Features are   consistent with a pseudonormal left ventricular filling pattern,   with concomitant abnormal relaxation and increased filling   pressure (grade 2 diastolic dysfunction). Doppler parameters are   consistent with elevated ventricular end-diastolic filling   pressure. - Aortic valve: Possibly bicuspid; severely thickened, severely   calcified leaflets. Valve mobility was restricted. There was   severe stenosis. There was mild regurgitation. Mean gradient (S):   47 mm Hg. Peak gradient (S): 77 mm Hg. Valve area (VTI): 0.71   cm^2. Valve area (Vmax): 0.76 cm^2. Valve area (Vmean): 0.75   cm^2. - Mitral valve: Calcified annulus. Mildly thickened leaflets .   There was mild regurgitation. - Left atrium: The atrium was moderately dilated. - Right ventricle: The cavity size was mildly dilated. Wall   thickness was normal. Systolic function was moderately reduced. - Right atrium: The atrium was normal in  size. - Tricuspid valve: There was mild regurgitation. - Pulmonic valve: There was mild regurgitation. - Pulmonary arteries: Systolic pressure was within the normal   range. - Inferior vena cava: The vessel was normal in size. - Pericardium, extracardiac: There was no pericardial effusion.  Impressions:  - Bicuspid aortic valve with severe stenosis and mild   regurgitation. LVEF 30-35%. Diffuse hypokinesis with akinesis of   the basal and mid inferior and inferolateral walls. Moderately   reduced RVEF.  ------------------------------------------------------------------- Labs, prior tests, procedures, and surgery: Coronary artery bypass grafting.  ------------------------------------------------------------------- Study data:  Comparison was made to the study of 03/04/2018.  Study status:  Routine.  Procedure:  The patient reported no pain pre or post test. Transthoracic echocardiography. Image quality was adequate.  Study completion:  There were no complications. Transthoracic echocardiography.  M-mode, complete 2D, spectral Doppler, and color Doppler.    Birthdate:  Patient birthdate: 10/30/1946.  Age:  Patient is 71 yr old.  Sex:  Gender: female. BMI: 30 kg/m^2.  Blood pressure:     140/67  Patient status: Inpatient.  Study date:  Study date: 03/24/2018. Study time: 03:10 PM.  Location:  Echo laboratory.  -------------------------------------------------------------------  ------------------------------------------------------------------- Left ventricle:  The cavity size was normal. There was moderate concentric hypertrophy. Systolic function was moderately to severely reduced. The estimated ejection fraction was in the range of 30% to 35%. Diffuse hypokinesis with akinesis of the basal and mid inferior and inferolateral walls. Features are consistent with a pseudonormal left ventricular filling pattern, with concomitant abnormal relaxation and increased filling  pressure (grade 2 diastolic dysfunction). Doppler parameters are consistent with elevated ventricular end-diastolic filling pressure.  ------------------------------------------------------------------- Aortic valve:   Possibly bicuspid; severely thickened, severely calcified leaflets. Valve mobility was restricted.  Doppler: There was severe stenosis.   There was mild regurgitation.    VTI ratio of LVOT to aortic valve: 0.2. Valve area (VTI): 0.71 cm^2. Indexed valve area (VTI): 0.39 cm^2/m^2. Peak velocity ratio of LVOT to aortic valve: 0.22. Valve area (Vmax): 0.76 cm^2. Indexed valve area (Vmax): 0.42 cm^2/m^2. Mean velocity ratio of LVOT to aortic valve: 0.22. Valve area (Vmean): 0.75 cm^2. Indexed valve area (Vmean): 0.41 cm^2/m^2.    Mean gradient (S): 47 mm Hg. Peak gradient (S): 77 mm Hg.  ------------------------------------------------------------------- Aorta:  Aortic root: The aortic root was normal in size.  ------------------------------------------------------------------- Mitral valve:   Calcified annulus. Mildly thickened leaflets . Mobility was not restricted.  Doppler:  Transvalvular velocity was within the normal range. There was no evidence for stenosis. There was mild regurgitation.    Peak gradient (D): 6 mm Hg.  ------------------------------------------------------------------- Left atrium:  The atrium was moderately dilated.  ------------------------------------------------------------------- Right ventricle:  The cavity size was mildly dilated. Wall thickness was normal. Systolic function was moderately reduced.   ------------------------------------------------------------------- Pulmonic valve:    Structurally normal valve.   Cusp separation was normal.  Doppler:  Transvalvular velocity was within the normal range. There was no evidence for stenosis. There was  mild regurgitation.  ------------------------------------------------------------------- Tricuspid valve:   Structurally normal valve.    Doppler: Transvalvular velocity was within the normal range. There was mild regurgitation.  ------------------------------------------------------------------- Pulmonary artery:   The main pulmonary artery was normal-sized. Systolic pressure was within the normal range.  ------------------------------------------------------------------- Right atrium:  The atrium was normal in size.  ------------------------------------------------------------------- Pericardium:  There was no pericardial effusion.  ------------------------------------------------------------------- Systemic veins: Inferior vena cava: The vessel was normal in size.  ------------------------------------------------------------------- Measurements   Left ventricle                           Value          Reference  LV ID, ED, PLAX chordal                  48.1  mm       43 - 52  LV ID, ES, PLAX chordal          (H)     42.5  mm       23 - 38  LV fx shortening, PLAX chordal   (L)     12    %        >=29  LV PW thickness, ED                        12.3  mm       ----------  IVS/LV PW ratio, ED                      1.07           <=1.3  Stroke volume, 2D                        91    ml       ----------  Stroke volume/bsa, 2D                    50    ml/m^2   ----------  LV e&', lateral                           4.7   cm/s     ----------  LV E/e&', lateral                         25.32          ----------  LV e&', medial                            2.27  cm/s     ----------  LV E/e&', medial                          52.42          ----------  LV e&', average                           3.49  cm/s     ----------  LV E/e&', average                         34.15          ----------    Ventricular septum                       Value          Reference  IVS thickness, ED                         13.2  mm       ----------    LVOT                                     Value          Reference  LVOT ID, S                               21    mm       ----------  LVOT area                                3.46  cm^2     ----------  LVOT peak velocity, S                    96.3  cm/s     ----------    LVOT mean velocity, S                    68.8  cm/s     ----------  LVOT VTI, S                              26.4  cm       ----------  LVOT peak gradient, S                    4     mm Hg    ----------    Aortic valve                             Value          Reference  Aortic valve peak velocity, S            440   cm/s     ----------  Aortic valve mean velocity, S            316   cm/s     ----------  Aortic valve VTI, S                      129   cm       ----------  Aortic mean gradient, S                  45    mm Hg    ----------  Aortic peak gradient, S                  77    mm Hg    ----------  VTI ratio, LVOT/AV                       0.2            ----------  Aortic valve area, VTI                   0.71  cm^2     ----------  Aortic valve area/bsa, VTI               0.39  cm^2/m^2 ----------  Velocity ratio, peak, LVOT/AV            0.22           ----------  Aortic valve area, peak velocity         0.76  cm^2     ----------  Aortic valve area/bsa, peak              0.42  cm^2/m^2 ----------  velocity  Velocity ratio, mean, LVOT/AV            0.22           ----------  Aortic valve area, mean velocity         0.75  cm^2     ----------  Aortic valve area/bsa, mean              0.41  cm^2/m^2 ----------  velocity  Aortic regurg pressure half-time         410   ms       ----------    Aorta                                      Value          Reference  Aortic root ID, ED                       27    mm       ----------    Left atrium                              Value          Reference  LA ID, A-P, ES                           46    mm       ----------  LA ID/bsa, A-P                    (H)     2.51  cm/m^2   <=2.2  LA volume, S                             76.1  ml       ----------  LA volume/bsa, S                         41.6  ml/m^2   ----------  LA volume, ES, 1-p A4C                   80.3  ml       ----------  LA volume/bsa, ES, 1-p A4C               43.9  ml/m^2   ----------  LA volume, ES, 1-p A2C                   70.9  ml       ----------  LA volume/bsa, ES, 1-p A2C               38.7  ml/m^2   ----------    Mitral valve                             Value          Reference  Mitral E-wave peak velocity              119   cm/s     ----------  Mitral deceleration time                 180   ms       150 - 230  Mitral peak gradient, D                  6     mm Hg    ----------    Tricuspid valve                          Value          Reference  Tricuspid regurg peak velocity           225   cm/s     ----------  Tricuspid peak RV-RA gradient            20    mm Hg    ----------    Right   atrium                             Value          Reference  RA ID, S-I, ES, A4C              (H)     52.5  mm       34 - 49  RA area, ES, A4C                         17.6  cm^2     8.3 - 19.5  RA volume, ES, A/L                       48.2  ml       ----------  RA volume/bsa, ES, A/L                   26.3  ml/m^2   ----------    Right ventricle                          Value          Reference  TAPSE                                    10.6  mm       ----------  RV s&', lateral, S                        6.32  cm/s     ----------  Legend: (L)  and  (H)  mark values outside specified reference range.  ------------------------------------------------------------------- Prepared and Electronically Authenticated by  Katarina Nelson, M.D. 2019-04-10T16:28:04   Physicians   Panel Physicians Referring Physician Case Authorizing Physician  Cooper, Michael, MD (Primary)    Procedures   LEFT HEART CATH AND CORS/GRAFTS ANGIOGRAPHY  Conclusion   1. Severe native  3 vessel CAD with total RCA occlusion, severe left main stenosis, severe ramus intermedius stenosis, and severe proximal LAD stenosis 2. Severe calcific aortic stenosis with mean transaortic gradient of 28 mmHg and peak instantaneous gradient of 50 mmHg 3. S/P CABG with continue patency of the LIMA-LAD with extreme graft tortuosity and total occlusion of the SVG's  Plan: review angio films with multidisciplinary team. Consider revascularization versus medical therapy. Considering her lack of angina and complexity of CAD with both left main and ramus stenosis, favor medical therapy as initial approach. Continue TAVR evaluation.   Indications   Severe aortic stenosis [I35.0 (ICD-10-CM)]  Procedural Details/Technique   Technical Details INDICATION: Severe aortic stenosis, prior CABG with unknown graft anatomy (done in [place] 2009). Pre-TAVR evaluation.  PROCEDURAL DETAILS: There was an indwelling IV in a right antecubital vein. Using normal sterile technique, the IV was changed out for a 5 Fr brachial sheath over a 0.018 inch wire. The left wrist was then prepped, draped, and anesthetized with 1% lidocaine. Using the modified Seldinger technique a 5/6 French Slender sheath was placed in the left radial artery. Intra-arterial verapamil was administered through the radial artery sheath. IV heparin was administered after a JR4 catheter was advanced into the central aorta. A Swan-Ganz catheter was used for the right heart catheterization, but the cathter would not advance into the   central venous circulation. Venography was performed. I was able to advance a glidewire into the heart but the swan ganz catheter would not track over the wire beyond the subclavian vein. Standard Judkins catheters were used for selective coronary angiography and bypass graft angiography. A pigtail catheter was used for aortography to evaluate for SVG patency. Catheter manipulation was extremely difficult because of subclavian artery  tortuosity. the aortic valve is crossed with an AL1 catheter and J-wireThere were no immediate procedural complications. The patient was transferred to the post catheterization recovery area for further monitoring.     Estimated blood loss <50 mL.  During this procedure the patient was administered the following to achieve and maintain moderate conscious sedation: Versed 1 mg, Fentanyl 25 mcg, while the patient's heart rate, blood pressure, and oxygen saturation were continuously monitored. The period of conscious sedation was 57 minutes, of which I was present face-to-face 100% of this time.  Coronary Findings   Diagnostic  Dominance: Right  Left Main  Mid LM to Dist LM lesion 90% stenosed  Mid LM to Dist LM lesion is 90% stenosed. The lesion is eccentric.  Ramus Intermedius  Vessel is large.  Ramus lesion 95% stenosed  Ramus lesion is 95% stenosed. large intermediate branch  Left Circumflex  There is mild diffuse disease throughout the vessel.  Right Coronary Artery  Prox RCA to Mid RCA lesion 100% stenosed  Prox RCA to Mid RCA lesion is 100% stenosed. The lesion is severely calcified.  Right Posterior Descending Artery  Collaterals  RPDA filled by collaterals from 1st Sept.    LIMA LIMA Graft to Mid LAD  LIMA. extremely tortuous LIMA graft with a moderate lesion in the distal body of the graft  Graft to Ramus  Origin to Prox Graft lesion 100% stenosed  Origin to Prox Graft lesion is 100% stenosed. this graft is not selectively imaged, but aortography demonstrates no SVG flow and there is no competitive filling seen in any of the circumflex branches.  Intervention   No interventions have been documented.  Coronary Diagrams   Diagnostic Diagram       Implants    No implant documentation for this case.  MERGE Images   Show images for CARDIAC CATHETERIZATION   Link to Procedure Log   Procedure Log    Hemo Data    Most Recent Value  Aortic Mean Gradient 27.9 mmHg   Aortic Peak Gradient 34 mmHg  AO Systolic Pressure 135 mmHg  AO Diastolic Pressure 53 mmHg  AO Mean 82 mmHg  LV Systolic Pressure 185 mmHg  LV Diastolic Pressure 6 mmHg  LV EDP 18 mmHg  Arterial Occlusion Pressure Extended Systolic Pressure 143 mmHg  Arterial Occlusion Pressure Extended Diastolic Pressure 54 mmHg  Arterial Occlusion Pressure Extended Mean Pressure 81 mmHg  Left Ventricular Apex Extended Systolic Pressure 177 mmHg  Left Ventricular Apex Extended Diastolic Pressure 14 mmHg  Left Ventricular Apex Extended EDP Pressure 22 mmHg    ADDENDUM REPORT: 04/01/2018 22:57  CLINICAL DATA:  71-year-old female with severe aortic stenosis being evaluated for a TAVR procedure.  EXAM: Cardiac TAVR CT  TECHNIQUE: The patient was scanned on a Phillips Force scanner. A 120 kV retrospective scan was triggered in the descending thoracic aorta at 111 HU's. Gantry rotation speed was 250 msecs and collimation was .6 mm. No beta blockade or nitro were given. The 3D data set was reconstructed in 5% intervals of the R-R cycle. Systolic and diastolic phases were analyzed on a dedicated   work station using MPR, MIP and VRT modes. The patient received 80 cc of contrast.  FINDINGS: Aortic Valve: Trileaflet aortic valve with severely calcified leaflets and severely restricted leaflets opening. Mild calcification are extending into the LVOT under the left and noncoronary cusp.  Aorta: Normal size, no dissection, mild diffuse calcifications.  Sinotubular Junction: 27 x 25 mm  Ascending Thoracic Aorta: 31 x 29 mm  Aortic Arch: 25 x 23 mm  Descending Thoracic Aorta: 19 x 19 mm  Sinus of Valsalva Measurements:  Non-coronary: 27 mm  Right -coronary: 26 mm  Left -coronary: 26 mm  Coronary Artery Height above Annulus:  Left Main: 17 mm  Right Coronary: 16 mm  Virtual Basal Annulus Measurements:  Maximum/Minimum Diameter: 28.2 x 19.8 mm  Mean Diameter: 23.5  mm  Perimeter: 77.2 mm  Area: 435 mm2  Optimum Fluoroscopic Angle for Delivery: LAO 6 CAU 10  IMPRESSION: 1. Trileaflet aortic valve with severely calcified leaflets and severely restricted leaflets opening. Mild calcification are extending into the LVOT under the left and noncoronary cusps. Annular measurements suitable for a delivery of a 26 mm Edwards-SAPIEN 3 valve.  2. Sufficient coronary to annulus distance.  3. Optimum Fluoroscopic Angle for Delivery:  LAO 6 CAU 10  4. No thrombus in the left atrial appendage.  5. Dilated pulmonary artery measuring 32 mm.   Electronically Signed   By: Katarina  Nelson   On: 04/01/2018 22:57       CLINICAL DATA:  71-year-old female with history of severe aortic stenosis. Preprocedural study prior to potential transcatheter aortic valve replacement (TAVR).  EXAM: CT ANGIOGRAPHY CHEST, ABDOMEN AND PELVIS  TECHNIQUE: Multidetector CT imaging through the chest, abdomen and pelvis was performed using the standard protocol during bolus administration of intravenous contrast. Multiplanar reconstructed images and MIPs were obtained and reviewed to evaluate the vascular anatomy.  CONTRAST:  100mL ISOVUE-370 IOPAMIDOL (ISOVUE-370) INJECTION 76%  COMPARISON:  No priors.  FINDINGS: CTA CHEST FINDINGS  Cardiovascular: Heart size is mildly enlarged with left atrial dilatation. Filling defect within the tip of the left atrial appendage (axial image 40 of series 14), concerning for potential left atrial appendage thrombus. There is aortic atherosclerosis, as well as atherosclerosis of the great vessels of the mediastinum and the coronary arteries, including calcified atherosclerotic plaque in the left main, left anterior descending, left circumflex and right coronary arteries. Status post median sternotomy for CABG including LIMA to the LAD. Severe thickening calcification of the aortic valve. Mild calcifications of  the mitral annulus and valve.  Mediastinum/Lymph Nodes: No pathologically enlarged mediastinal or hilar lymph nodes. Esophagus is unremarkable in appearance. No axillary lymphadenopathy. Large heterogeneously enhancing nodule extending anteriorly from the isthmus of the thyroid gland measuring 3.3 x 2.9 cm.  Lungs/Pleura: No suspicious appearing pulmonary nodules or masses. No acute consolidative airspace disease. No pleural effusions.  Musculoskeletal/Soft Tissues: Median sternotomy wires. There are no aggressive appearing lytic or blastic lesions noted in the visualized portions of the skeleton.  CTA ABDOMEN AND PELVIS FINDINGS  Hepatobiliary: There are 2 lesions in the liver, both in the right lobe, largest of which measures 2.4 x 1.6 cm predominantly in segment 8 of the liver (axial image 230 of series 13). Both of these lesions appear centrally low-attenuation with peripheral nodular enhancement, suggestive of cavernous hemangiomas, but incompletely characterized on today's arterial phase examination. No other hepatic lesions. No intra or extrahepatic biliary ductal dilatation. Small calcified gallstones measuring up to 6 mm lying dependently in the gallbladder. No   surrounding inflammatory changes are noted to suggest an acute cholecystitis at this time.  Pancreas: No pancreatic mass. No pancreatic ductal dilatation. No pancreatic or peripancreatic fluid or inflammatory changes.  Spleen: Unremarkable.  Adrenals/Urinary Tract: 1.7 cm simple cyst in the interpolar region of the right kidney. Severe multifocal cortical thinning throughout the kidneys bilaterally. No suspicious renal lesions. No hydroureteronephrosis. Urinary bladder is normal in appearance. Bilateral adrenal glands are normal in appearance.  Stomach/Bowel: Normal appearance of the stomach. No pathologic dilatation of small bowel or colon. The appendix is not confidently identified and may be  surgically absent. Regardless, there are no inflammatory changes noted adjacent to the cecum to suggest the presence of an acute appendicitis at this time.  Vascular/Lymphatic: Extensive aortic atherosclerosis with vascular findings and measurements pertinent to potential TAVR procedure, as detailed below. Celiac axis, superior mesenteric artery and inferior mesenteric artery are all widely patent without hemodynamically significant stenosis. Single right and 2 left renal arteries are all widely patent without hemodynamically significant stenosis. No lymphadenopathy noted in the abdomen or pelvis.  Reproductive: Uterus and ovaries are unremarkable in appearance.  Other: No significant volume of ascites.  No pneumoperitoneum.  Musculoskeletal: There are no aggressive appearing lytic or blastic lesions noted in the visualized portions of the skeleton.  VASCULAR MEASUREMENTS PERTINENT TO TAVR:  AORTA:  Minimal Aortic Diameter-9 x 12 mm  Severity of Aortic Calcification-severe  RIGHT PELVIS:  Right Common Iliac Artery -  Minimal Diameter-10.9 x 9.5 mm  Tortuosity-mild  Calcification-severe  Right External Iliac Artery -  Minimal Diameter-5.4 x 5.8 mm  Tortuosity-mild  Calcification-mild  Right Common Femoral Artery -  Minimal Diameter-6.2 x 5.5 mm  Tortuosity-mild  Calcification-mild  LEFT PELVIS:  Left Common Iliac Artery -  Minimal Diameter-8.5 x 3.9 mm  Tortuosity-mild  Calcification-severe  Left External Iliac Artery -  Minimal Diameter-6.8 x 6.8 mm  Tortuosity-mild  Calcification-mild  Left Common Femoral Artery -  Minimal Diameter-6.5 x 5.5 mm  Tortuosity-mild  Calcification-mild-to-moderate  Review of the MIP images confirms the above findings.  IMPRESSION: 1. Vascular findings and measurements pertinent to potential TAVR procedure, as detailed above. 2. Severe thickening calcification of the  aortic valve, compatible with the reported clinical history of severe aortic stenosis. 3. Cardiomegaly with left atrial dilatation. Notably, there is a filling defect in the tip of the left atrial appendage concerning for potential left atrial appendage thrombus (although this could simply reflect so-called "pseudo thrombus", related to incomplete mixing of contrast). Correlation with transesophageal echocardiography is suggested if not already recently obtained. 4. Aortic atherosclerosis, in addition to left main and 3 vessel coronary artery disease. Status post median sternotomy for CABG including LIMA to the LAD. 5. Heterogeneously enhancing nodule extending anteriorly from the isthmus of the thyroid gland measuring 3.3 x 2.9 cm. Further evaluation with nonemergent thyroid ultrasound is strongly recommended in the near future to determine better evaluate this lesion and determine the need for potential fine-needle aspiration. 6. 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 These are incompletely evaluated on today's arterial phase examination and could be definitively characterized with MRI of the abdomen with and without IV gadolinium. 7. Cholelithiasis without evidence of acute cholecystitis at this time. 8. Additional incidental findings, as above.  Aortic Atherosclerosis (ICD10-I70.0).   Electronically Signed   By: Daniel  Entrikin M.D.   On: 04/01/2018 11:04   Impression:  This 71-year-old woman has stage D, severe, symptomatic aortic stenosis with [place]   Heart [employer] functional class II symptoms of exertional fatigue and shortness of breath consistent with chronic diastolic congestive heart failure.  Her left ventricular ejection fraction was noted to be 55 to 60% by echocardiogram on 03/04/2018 but has now decreased to 30 to 35% on her most recent echo dated 03/24/2018 suggesting that there is now a component of  chronic systolic congestive heart failure.  I have personally reviewed her echocardiogram, cardiac catheterization, and CTA studies.  Her echocardiogram shows a possibly bicuspid aortic valve that is severely calcified and thickened with restricted mobility.  The mean gradient is 47 mmHg consistent with severe aortic stenosis.  Cardiac catheterization shows severe left main and native three-vessel coronary disease with a high-grade left main, proximal LAD, and ramus stenosis and total occlusion of the right coronary artery.  She has a patent left internal mammary graft to the LAD but her prior vein grafts appear occluded.  There were no graft markers placed at her surgery.  She has not had any chest discomfort and PCI of her coronary stenoses would be quite complex so it may be best just to continue medical therapy at this time.  Her operative mortality for open surgical aortic valve replacement would be at least moderately elevated due to her prior coronary bypass surgery, advanced age, stage III-IV chronic kidney disease with a creatinine of 1.5-1.6 and other comorbid factors including reduced mobility and a sedentary lifestyle.  I think transcatheter aortic valve replacement would be a reasonable alternative for her with continued medical treatment of her complex coronary disease.  Her gated cardiac CTA shows anatomy that is favorable for transcatheter aortic valve replacement.  She does have mild calcification extending into the LVOT under the left and noncoronary cusps but I do not expect that to cause much problem.  Her CTA of the abdomen and pelvis shows adequate vasculature to allow transfemoral insertion from the right side.  She does have severe calcification and narrowing of the left common iliac artery but I think there is an adequate lumen to allow insertion of a diagnostic catheter on that side.  There was a question of a left atrial thrombus on her CTA of the chest but her gated cardiac CTA does not  show any thrombus in the left atrial appendage.  Her chest CT also shows a heterogeneously enhancing nodule in the anterior isthmus of the thyroid gland measuring 3.3 x 2.9 cm.  She has a history of thyroid nodules and prior ultrasounds in the past will probably need a follow-up ultrasound to further evaluate this nodule.  This can be done postoperatively.  The patient and her two sisters were counseled at length regarding treatment alternatives for management of severe symptomatic aortic stenosis. The risks and benefits of surgical intervention has been discussed in detail. Long-term prognosis with medical therapy was discussed. Alternative approaches such as conventional surgical aortic valve replacement, transcatheter aortic valve replacement, and palliative medical therapy were compared and contrasted at length. This discussion was placed in the context of the patient's own specific clinical presentation and past medical history. All of their questions have been addressed.   Following the decision to proceed with transcatheter aortic valve replacement, a discussion was held regarding what types of management strategies would be attempted intraoperatively in the event of life-threatening complications, including whether or not the patient would be considered a candidate for the use of cardiopulmonary bypass and/or conversion to open sternotomy for attempted surgical intervention.   The patient has been advised of a   variety of complications that might develop including but not limited to risks of death, stroke, paravalvular leak, aortic dissection or other major vascular complications, aortic annulus rupture, device embolization, cardiac rupture or perforation, mitral regurgitation, acute myocardial infarction, arrhythmia, heart block or bradycardia requiring permanent pacemaker placement, congestive heart failure, respiratory failure, renal failure, pneumonia, infection, other late complications related to  structural valve deterioration or migration, or other complications that might ultimately cause a temporary or permanent loss of functional independence or other long term morbidity. The patient provides full informed consent for the procedure as described and all questions were answered.     Plan:  Right transfemoral transcatheter aortic valve replacement will tentatively be scheduled on 05/11/2018.   I spent 60 minutes performing this consultation and > 50% of this time was spent face to face counseling and coordinating the care of this patient's severe, symptomatic aortic stenosis.  Bryan K. Bartle, MD 04/21/2018    

## 2018-04-23 NOTE — H&P (View-Only) (Signed)
Patient ID: Sydney Ingram, female   DOB: 1946/09/18, 72 y.o.   MRN: [id number]  [facility]  MULTIDISCIPLINARY HEART VALVE CLINIC  CARDIOTHORACIC SURGERY CONSULTATION REPORT  Referring Provider is Sherrill Raring, MD PCP is Sherrill Raring, MD  Chief Complaint  Patient presents with  . Aortic Stenosis    72nd TAVR consultation    HPI:  The patient is a 72 year old woman with a history of hypertension, hyperlipidemia, type 2 DM, stage lll-lV chronic kidney disease, coronary artery disease s/p PCI and stenting about 12 years ago in [place] followed by restenosis and CABG at [facility] in 2009, aortic stenosis and chronic diastolic heart failure. She moved to [facility] about 5 years ago and has been followed by Dr. Hanley Hays. Serial echocardiograms have shown progressive aortic stenosis and her most recent done by Dr. Hanley Hays on 03/04/2018 showed a mean transvalvular gradient of 45 mm Hg, AVA of 0.6 cm2 and an EF of 55-60%. She had a repeat echo here on 03/24/2018 which showed a possible bicuspid aortic valve that was severely calcified and thickened with restricted mobility.  The mean aortic valve gradient was 47 mmHg.  The dimensionless index was 0.22.  Left ventricular ejection fraction was reduced to 30 to 35% with diffuse hypokinesis with akinesis of the basal and mid inferior and inferolateral walls.  There is grade 2 diastolic dysfunction.  Cardiac catheterization on 03/24/2018 showed severe native three-vessel coronary disease with total occlusion of the right coronary artery, severe left main stenosis, severe ramus stenosis, and severe proximal LAD stenosis.  There is continued patency of the left internal mammary graft to the LAD with total occlusion of the saphenous vein grafts.  The mean aortic valve gradient was 20 mmHg with a peak gradient of 50 mmHg.  The LVEDP was 18 mm Hg.  The patient is here today with her 2 sisters whom she lives with.  She reports that she has  been fairly [place].  She is to get out and walk but has not been doing that over the past couple years.  Her main activity is singing in her church choir and her and her daughters have noted that she gets short of breath after singing 1 or 2 songs.  She has no limitations walking around her house.  She has had no symptoms at rest.  She denies orthopnea and PND.  She has had some lower extremity edema which is improved since being on diuretics.  She denies any chest pain or pressure or tightness.  She has had no dizziness or syncope.  Past Medical History:  Diagnosis Date  . Aortic stenosis   . CAD (coronary artery disease)   . Cardiomegaly   . Chronic diastolic (congestive) heart failure (HCC)   . Chronic kidney disease (CKD), stage III (moderate) (HCC)   . CKD (chronic kidney disease)    stage4  . Degenerative lumbar disc   . Diabetes type 2, controlled (HCC)   . Essential hypertension   . GERD (gastroesophageal reflux disease)   . High risk medication use   . Hyperlipidemia   . Hyperlipidemia   . Insomnia   . Menopause   . Mitral regurgitation   . PAD (peripheral artery disease) (HCC)   . Palpitation   . Pulmonary hypertension (HCC)   . Sciatica   . Thyroid nodule   . Type II diabetes mellitus (HCC)     Past Surgical History:  Procedure Laterality Date  .  CORONARY ARTERY BYPASS GRAFT  2009   CABG x3 - [facility]  . LEFT HEART CATH AND CORS/GRAFTS ANGIOGRAPHY N/A 03/24/2018   Procedure: LEFT HEART CATH AND CORS/GRAFTS ANGIOGRAPHY;  Surgeon: Tonny Bollman, MD;  Location: [facility] INVASIVE CV LAB;  Service: Cardiovascular;  Laterality: N/A;  . PARTIAL HYSTERECTOMY    . PERCUTANEOUS CORONARY STENT INTERVENTION (PCI-S)  2008   details unclear    Family History  Problem Relation Age of Onset  . Diabetes Mother   . Diabetes Sister   . Hyperlipidemia Sister   . Heart disease Brother     Social History   Socioeconomic History  . Marital status: Legally Separated     Spouse name: Not on file  . Number of children: Not on file  . Years of education: Not on file  . Highest education level: Not on file  Occupational History  . Not on file  Social Needs  . Financial resource strain: Not on file  . Food insecurity:    Worry: Not on file    Inability: Not on file  . Transportation needs:    Medical: Not on file    Non-medical: Not on file  Tobacco Use  . Smoking status: Never Smoker  . Smokeless tobacco: Never Used  Substance and Sexual Activity  . Alcohol use: Not Currently    Frequency: Never  . Drug use: Never  . Sexual activity: Not Currently  Lifestyle  . Physical activity:    Days per week: Not on file    Minutes per session: Not on file  . Stress: Not on file  Relationships  . Social connections:    Talks on phone: Not on file    Gets together: Not on file    Attends religious service: Not on file    Active member of club or organization: Not on file    Attends meetings of clubs or organizations: Not on file    Relationship status: Not on file  . Intimate partner violence:    Fear of current or ex partner: Not on file    Emotionally abused: Not on file    Physically abused: Not on file    Forced sexual activity: Not on file  Other Topics Concern  . Not on file  Social History Narrative  . Not on file    Current Outpatient Medications  Medication Sig Dispense Refill  . amLODipine (NORVASC) 10 MG tablet Take 10 mg by mouth daily.    Marland Kitchen aspirin EC 81 MG tablet Take 81 mg by mouth daily.    Marland Kitchen atorvastatin (LIPITOR) 20 MG tablet Take 20 mg by mouth daily.    . Biotin w/ Vitamins C & E (HAIR/SKIN/NAILS PO) Take 1 tablet by mouth daily.    . carvedilol (COREG) 25 MG tablet Take 25 mg by mouth 2 (two) times daily with a meal.    . clopidogrel (PLAVIX) 75 MG tablet Take 75 mg by mouth daily.    Marland Kitchen co-enzyme Q-10 30 MG capsule Take 30 mg by mouth daily.    . furosemide (LASIX) 20 MG tablet Take 20 mg by mouth every other day.    Marland Kitchen  glipiZIDE (GLUCOTROL XL) 10 MG 24 hr tablet Take 10 mg by mouth daily with breakfast.    . linagliptin (TRADJENTA) 5 MG TABS tablet Take 5 mg by mouth daily.    Marland Kitchen losartan (COZAAR) 50 MG tablet Take 50 mg by mouth daily.    . Multiple Vitamin (  MULTIVITAMIN) tablet Take 1 tablet by mouth daily.    Marland Kitchen omeprazole (PRILOSEC) 20 MG capsule Take 20 mg by mouth daily.    Marland Kitchen Propylene Glycol (SYSTANE BALANCE) 0.6 % SOLN Place 1 drop into both eyes 2 (two) times daily as needed (eye irritation).     No current facility-administered medications for this visit.     Allergies  Allergen Reactions  . Penicillins Other (See Comments)    Yeast infection      Review of Systems:   General:  decreased appetite, decreased energy, no weight gain, + weight loss, no fever  Cardiac:  no chest pain with exertion, no chest pain at rest, + SOB with moderate exertion, no resting SOB, no PND, no orthopnea, occasional palpitations, no arrhythmia, no atrial fibrillation, mild LE edema, no dizzy spells, no syncope  Respiratory:  exertional shortness of breath, no home oxygen, no productive cough, no dry cough, no bronchitis, no wheezing, no hemoptysis, no asthma, no pain with inspiration or cough, no sleep apnea, no CPAP at night  GI:   no difficulty swallowing, no reflux, no frequent heartburn, no hiatal hernia, no abdominal pain, no constipation, no diarrhea, no hematochezia, no hematemesis, no melena  GU:   no dysuria,  no frequency, no urinary tract infection, no hematuria,  no kidney stones, + kidney disease  Vascular:  no pain suggestive of claudication, + pain in feet, no leg cramps, no varicose veins, no DVT, no non-healing foot ulcer  Neuro:   no stroke, no TIA's, no seizures, no headaches, no temporary blindness one eye,  no slurred speech, no peripheral neuropathy, no chronic pain, no instability of gait, mild memory/cognitive dysfunction  Musculoskeletal: + arthritis, no joint swelling, no myalgias, mild  difficulty walking, normal mobility   Skin:   no rash, no itching, no skin infections, no pressure sores or ulcerations  Psych:   no anxiety, no depression, no nervousness, no unusual recent stress  Eyes:   no blurry vision, no floaters, no recent vision changes, does not wear glasses or contacts  ENT:   no hearing loss, no loose or painful teeth, + dentures, last saw dentist 6 months ago  Hematologic:  no easy bruising, no abnormal bleeding, no clotting disorder, no frequent epistaxis  Endocrine:  + diabetes, does not check CBG's at home, + hx of thyroid nodules that have been followed with ultrasound.       Physical Exam:   BP (!) 143/71 (BP Location: Right Arm, Patient Position: Sitting, Cuff Size: Normal)   Pulse 65   Resp 18   Ht 5\' 3"  (1.6 m)   Wt 165 lb 3.2 oz (74.9 kg)   SpO2 98% Comment: RA  BMI 29.26 kg/m   General:  Obese but  well-appearing  HEENT:  Unremarkable, NCAT, PERLA, EOMI, oropharynx clear  Neck:   no JVD, no bruits, no adenopathy   Chest:   clear to auscultation, symmetrical breath sounds, no wheezes, no rhonchi   CV:   RRR, grade III/VI crescendo/decrescendo murmur heard best at RSB,  no diastolic murmur  Abdomen:  soft, non-tender, no masses or organomegaly  Extremities:  warm, well-perfused, pulses diminished, trace LE edema in ankles  Rectal/GU  Deferred  Neuro:   Grossly non-focal and symmetrical throughout  Skin:   Clean and dry, no rashes, no breakdown   Diagnostic Tests:      *[place]*                   *  [facility]*                         [address]                        [place], [place] [address]                            [phone]  ------------------------------------------------------------------- Transthoracic Echocardiography  Patient:    Talia, Hoheisel MR #:       [id number] Study Date: 03/24/2018 Gender:     F Age:        23 Height:     157.5 cm Weight:     74.4 kg BSA:        1.83 m^2 Pt.  Status: Room:   ADMITTING    Tonny Bollman, MD  ATTENDING    Tonny Bollman, MD  ORDERING     Tonny Bollman, MD  REFERRING    Tonny Bollman, MD  PERFORMING   [facility], Inpatient  SONOGRAPHER  Thurman Coyer  cc:  ------------------------------------------------------------------- LV EF: 30% -   35%  ------------------------------------------------------------------- Indications:      Aortic stenosis 424.1.  ------------------------------------------------------------------- History:   PMH:   Coronary artery disease.  Risk factors:  Current tobacco use. Dyslipidemia.  ------------------------------------------------------------------- Study Conclusions  - Left ventricle: The cavity size was normal. There was moderate   concentric hypertrophy. Systolic function was moderately to   severely reduced. The estimated ejection fraction was in the   range of 30% to 35%. Diffuse hypokinesis with akinesis of the   basal and mid inferior and inferolateral walls. Features are   consistent with a pseudonormal left ventricular filling pattern,   with concomitant abnormal relaxation and increased filling   pressure (grade 2 diastolic dysfunction). Doppler parameters are   consistent with elevated ventricular end-diastolic filling   pressure. - Aortic valve: Possibly bicuspid; severely thickened, severely   calcified leaflets. Valve mobility was restricted. There was   severe stenosis. There was mild regurgitation. Mean gradient (S):   47 mm Hg. Peak gradient (S): 77 mm Hg. Valve area (VTI): 0.71   cm^2. Valve area (Vmax): 0.76 cm^2. Valve area (Vmean): 0.75   cm^2. - Mitral valve: Calcified annulus. Mildly thickened leaflets .   There was mild regurgitation. - Left atrium: The atrium was moderately dilated. - Right ventricle: The cavity size was mildly dilated. Wall   thickness was normal. Systolic function was moderately reduced. - Right atrium: The atrium was normal in  size. - Tricuspid valve: There was mild regurgitation. - Pulmonic valve: There was mild regurgitation. - Pulmonary arteries: Systolic pressure was within the normal   range. - Inferior vena cava: The vessel was normal in size. - Pericardium, extracardiac: There was no pericardial effusion.  Impressions:  - Bicuspid aortic valve with severe stenosis and mild   regurgitation. LVEF 30-35%. Diffuse hypokinesis with akinesis of   the basal and mid inferior and inferolateral walls. Moderately   reduced RVEF.  ------------------------------------------------------------------- Labs, prior tests, procedures, and surgery: Coronary artery bypass grafting.  ------------------------------------------------------------------- Study data:  Comparison was made to the study of 03/04/2018.  Study status:  Routine.  Procedure:  The patient reported no pain pre or post test. Transthoracic echocardiography. Image quality was adequate.  Study completion:  There were no complications. Transthoracic echocardiography.  M-mode, complete 2D, spectral Doppler, and color Doppler.  Birthdate:  Patient birthdate: [birth year].  Age:  Patient is 72 yr old.  Sex:  Gender: female. BMI: 30 kg/m^2.  Blood pressure:     140/67  Patient status: Inpatient.  Study date:  Study date: 03/24/2018. Study time: 03:10 PM.  Location:  Echo laboratory.  -------------------------------------------------------------------  ------------------------------------------------------------------- Left ventricle:  The cavity size was normal. There was moderate concentric hypertrophy. Systolic function was moderately to severely reduced. The estimated ejection fraction was in the range of 30% to 35%. Diffuse hypokinesis with akinesis of the basal and mid inferior and inferolateral walls. Features are consistent with a pseudonormal left ventricular filling pattern, with concomitant abnormal relaxation and increased filling  pressure (grade 2 diastolic dysfunction). Doppler parameters are consistent with elevated ventricular end-diastolic filling pressure.  ------------------------------------------------------------------- Aortic valve:   Possibly bicuspid; severely thickened, severely calcified leaflets. Valve mobility was restricted.  Doppler: There was severe stenosis.   There was mild regurgitation.    VTI ratio of LVOT to aortic valve: 0.2. Valve area (VTI): 0.71 cm^2. Indexed valve area (VTI): 0.39 cm^2/m^2. Peak velocity ratio of LVOT to aortic valve: 0.22. Valve area (Vmax): 0.76 cm^2. Indexed valve area (Vmax): 0.42 cm^2/m^2. Mean velocity ratio of LVOT to aortic valve: 0.22. Valve area (Vmean): 0.75 cm^2. Indexed valve area (Vmean): 0.41 cm^2/m^2.    Mean gradient (S): 47 mm Hg. Peak gradient (S): 77 mm Hg.  ------------------------------------------------------------------- Aorta:  Aortic root: The aortic root was normal in size.  ------------------------------------------------------------------- Mitral valve:   Calcified annulus. Mildly thickened leaflets . Mobility was not restricted.  Doppler:  Transvalvular velocity was within the normal range. There was no evidence for stenosis. There was mild regurgitation.    Peak gradient (D): 6 mm Hg.  ------------------------------------------------------------------- Left atrium:  The atrium was moderately dilated.  ------------------------------------------------------------------- Right ventricle:  The cavity size was mildly dilated. Wall thickness was normal. Systolic function was moderately reduced.   ------------------------------------------------------------------- Pulmonic valve:    Structurally normal valve.   Cusp separation was normal.  Doppler:  Transvalvular velocity was within the normal range. There was no evidence for stenosis. There was  mild regurgitation.  ------------------------------------------------------------------- Tricuspid valve:   Structurally normal valve.    Doppler: Transvalvular velocity was within the normal range. There was mild regurgitation.  ------------------------------------------------------------------- Pulmonary artery:   The main pulmonary artery was normal-sized. Systolic pressure was within the normal range.  ------------------------------------------------------------------- Right atrium:  The atrium was normal in size.  ------------------------------------------------------------------- Pericardium:  There was no pericardial effusion.  ------------------------------------------------------------------- Systemic veins: Inferior vena cava: The vessel was normal in size.  ------------------------------------------------------------------- Measurements   Left ventricle                           Value          Reference  LV ID, ED, PLAX chordal                  48.1  mm       43 - 52  LV ID, ES, PLAX chordal          (H)     42.5  mm       23 - 38  LV fx shortening, PLAX chordal   (L)     12    %        >=29  LV PW thickness, ED  12.3  mm       ----------  IVS/LV PW ratio, ED                      1.07           <=1.3  Stroke volume, 2D                        91    ml       ----------  Stroke volume/bsa, 2D                    50    ml/m^2   ----------  LV e&', lateral                           4.7   cm/s     ----------  LV E/e&', lateral                         25.32          ----------  LV e&', medial                            2.27  cm/s     ----------  LV E/e&', medial                          52.42          ----------  LV e&', average                           3.49  cm/s     ----------  LV E/e&', average                         34.15          ----------    Ventricular septum                       Value          Reference  IVS thickness, ED                         13.2  mm       ----------    LVOT                                     Value          Reference  LVOT ID, S                               21    mm       ----------  LVOT area                                3.46  cm^2     ----------  LVOT peak velocity, S                    96.3  cm/s     ----------  LVOT mean velocity, S                    68.8  cm/s     ----------  LVOT VTI, S                              26.4  cm       ----------  LVOT peak gradient, S                    4     mm Hg    ----------    Aortic valve                             Value          Reference  Aortic valve peak velocity, S            440   cm/s     ----------  Aortic valve mean velocity, S            316   cm/s     ----------  Aortic valve VTI, S                      129   cm       ----------  Aortic mean gradient, S                  45    mm Hg    ----------  Aortic peak gradient, S                  77    mm Hg    ----------  VTI ratio, LVOT/AV                       0.2            ----------  Aortic valve area, VTI                   0.71  cm^2     ----------  Aortic valve area/bsa, VTI               0.39  cm^2/m^2 ----------  Velocity ratio, peak, LVOT/AV            0.22           ----------  Aortic valve area, peak velocity         0.76  cm^2     ----------  Aortic valve area/bsa, peak              0.42  cm^2/m^2 ----------  velocity  Velocity ratio, mean, LVOT/AV            0.22           ----------  Aortic valve area, mean velocity         0.75  cm^2     ----------  Aortic valve area/bsa, mean              0.41  cm^2/m^2 ----------  velocity  Aortic regurg pressure half-time         410   ms       ----------    Aorta  Value          Reference  Aortic root ID, ED                       27    mm       ----------    Left atrium                              Value          Reference  LA ID, A-P, ES                           46    mm       ----------  LA ID/bsa, A-P                    (H)     2.51  cm/m^2   <=2.2  LA volume, S                             76.1  ml       ----------  LA volume/bsa, S                         41.6  ml/m^2   ----------  LA volume, ES, 1-p A4C                   80.3  ml       ----------  LA volume/bsa, ES, 1-p A4C               43.9  ml/m^2   ----------  LA volume, ES, 1-p A2C                   70.9  ml       ----------  LA volume/bsa, ES, 1-p A2C               38.7  ml/m^2   ----------    Mitral valve                             Value          Reference  Mitral E-wave peak velocity              119   cm/s     ----------  Mitral deceleration time                 180   ms       150 - 230  Mitral peak gradient, D                  6     mm Hg    ----------    Tricuspid valve                          Value          Reference  Tricuspid regurg peak velocity           225   cm/s     ----------  Tricuspid peak RV-RA gradient            20    mm Hg    ----------    Right  atrium                             Value          Reference  RA ID, S-I, ES, A4C              (H)     52.5  mm       34 - 49  RA area, ES, A4C                         17.6  cm^2     8.3 - 19.5  RA volume, ES, A/L                       48.2  ml       ----------  RA volume/bsa, ES, A/L                   26.3  ml/m^2   ----------    Right ventricle                          Value          Reference  TAPSE                                    10.6  mm       ----------  RV s&', lateral, S                        6.32  cm/s     ----------  Legend: (L)  and  (H)  mark values outside specified reference range.  ------------------------------------------------------------------- Prepared and Electronically Authenticated by  Tobias Alexander, M.D. 2019-04-10T16:28:04   Physicians   Panel Physicians Referring Physician Case Authorizing Physician  Tonny Bollman, MD (Primary)    Procedures   LEFT HEART CATH AND CORS/GRAFTS ANGIOGRAPHY  Conclusion   1. Severe native  3 vessel CAD with total RCA occlusion, severe left main stenosis, severe ramus intermedius stenosis, and severe proximal LAD stenosis 2. Severe calcific aortic stenosis with mean transaortic gradient of 28 mmHg and peak instantaneous gradient of 50 mmHg 3. S/P CABG with continue patency of the LIMA-LAD with extreme graft tortuosity and total occlusion of the SVG's  Plan: review angio films with multidisciplinary team. Consider revascularization versus medical therapy. Considering her lack of angina and complexity of CAD with both left main and ramus stenosis, favor medical therapy as initial approach. Continue TAVR evaluation.   Indications   Severe aortic stenosis [I35.0 (ICD-10-CM)]  Procedural Details/Technique   Technical Details INDICATION: Severe aortic stenosis, prior CABG with unknown graft anatomy (done in [facility] 2009). Pre-TAVR evaluation.  PROCEDURAL DETAILS: There was an indwelling IV in a right antecubital vein. Using normal sterile technique, the IV was changed out for a 5 Fr brachial sheath over a 0.018 inch wire. The left wrist was then prepped, draped, and anesthetized with 1% lidocaine. Using the modified Seldinger technique a 5/6 French Slender sheath was placed in the left radial artery. Intra-arterial verapamil was administered through the radial artery sheath. IV heparin was administered after a JR4 catheter was advanced into the central aorta. A Swan-Ganz catheter was used for the right heart catheterization, but the cathter would not advance into the  central venous circulation. Venography was performed. I was able to advance a glidewire into the heart but the swan ganz catheter would not track over the wire beyond the subclavian vein. Standard Judkins catheters were used for selective coronary angiography and bypass graft angiography. A pigtail catheter was used for aortography to evaluate for SVG patency. Catheter manipulation was extremely difficult because of subclavian artery  tortuosity. the aortic valve is crossed with an AL1 catheter and J-wireThere were no immediate procedural complications. The patient was transferred to the post catheterization recovery area for further monitoring.     Estimated blood loss <50 mL.  During this procedure the patient was administered the following to achieve and maintain moderate conscious sedation: Versed 1 mg, Fentanyl 25 mcg, while the patient's heart rate, blood pressure, and oxygen saturation were continuously monitored. The period of conscious sedation was 57 minutes, of which I was present face-to-face 100% of this time.  Coronary Findings   Diagnostic  Dominance: Right  Left Main  Mid LM to Dist LM lesion 90% stenosed  Mid LM to Dist LM lesion is 90% stenosed. The lesion is eccentric.  Ramus Intermedius  Vessel is large.  Ramus lesion 95% stenosed  Ramus lesion is 95% stenosed. large intermediate branch  Left Circumflex  There is mild diffuse disease throughout the vessel.  Right Coronary Artery  Prox RCA to Mid RCA lesion 100% stenosed  Prox RCA to Mid RCA lesion is 100% stenosed. The lesion is severely calcified.  Right Posterior Descending Artery  Collaterals  RPDA filled by collaterals from 1st Sept.    LIMA LIMA Graft to Mid LAD  LIMA. extremely tortuous LIMA graft with a moderate lesion in the distal body of the graft  Graft to Ramus  Origin to Prox Graft lesion 100% stenosed  Origin to Prox Graft lesion is 100% stenosed. this graft is not selectively imaged, but aortography demonstrates no SVG flow and there is no competitive filling seen in any of the circumflex branches.  Intervention   No interventions have been documented.  Coronary Diagrams   Diagnostic Diagram       Implants    No implant documentation for this case.  MERGE Images   Show images for CARDIAC CATHETERIZATION   Link to Procedure Log   Procedure Log    Hemo Data    Most Recent Value  Aortic Mean Gradient 27.9 mmHg   Aortic Peak Gradient 34 mmHg  AO Systolic Pressure 135 mmHg  AO Diastolic Pressure 53 mmHg  AO Mean 82 mmHg  LV Systolic Pressure 185 mmHg  LV Diastolic Pressure 6 mmHg  LV EDP 18 mmHg  Arterial Occlusion Pressure Extended Systolic Pressure 143 mmHg  Arterial Occlusion Pressure Extended Diastolic Pressure 54 mmHg  Arterial Occlusion Pressure Extended Mean Pressure 81 mmHg  Left Ventricular Apex Extended Systolic Pressure 177 mmHg  Left Ventricular Apex Extended Diastolic Pressure 14 mmHg  Left Ventricular Apex Extended EDP Pressure 22 mmHg    ADDENDUM REPORT: 04/01/2018 22:57  CLINICAL DATA:  72 year old female with severe aortic stenosis being evaluated for a TAVR procedure.  EXAM: Cardiac TAVR CT  TECHNIQUE: The patient was scanned on a [employer]. A 120 kV retrospective scan was triggered in the descending thoracic aorta at 111 HU's. Gantry rotation speed was 250 msecs and collimation was .6 mm. No beta blockade or nitro were given. The 3D data set was reconstructed in 5% intervals of the R-R cycle. Systolic and diastolic phases were analyzed on a dedicated  work station using MPR, MIP and VRT modes. The patient received 80 cc of contrast.  FINDINGS: Aortic Valve: Trileaflet aortic valve with severely calcified leaflets and severely restricted leaflets opening. Mild calcification are extending into the LVOT under the left and noncoronary cusp.  Aorta: Normal size, no dissection, mild diffuse calcifications.  Sinotubular Junction: 27 x 25 mm  Ascending Thoracic Aorta: 31 x 29 mm  Aortic Arch: 25 x 23 mm  Descending Thoracic Aorta: 19 x 19 mm  Sinus of Valsalva Measurements:  Non-coronary: 27 mm  Right -coronary: 26 mm  Left -coronary: 26 mm  Coronary Artery Height above Annulus:  Left Main: 17 mm  Right Coronary: 16 mm  Virtual Basal Annulus Measurements:  Maximum/Minimum Diameter: 28.2 x 19.8 mm  Mean Diameter: 23.5  mm  Perimeter: 77.2 mm  Area: 435 mm2  Optimum Fluoroscopic Angle for Delivery: LAO 6 CAU 10  IMPRESSION: 1. Trileaflet aortic valve with severely calcified leaflets and severely restricted leaflets opening. Mild calcification are extending into the LVOT under the left and noncoronary cusps. Annular measurements suitable for a delivery of a 26 mm Edwards-SAPIEN 3 valve.  2. Sufficient coronary to annulus distance.  3. Optimum Fluoroscopic Angle for Delivery:  LAO 6 CAU 10  4. No thrombus in the left atrial appendage.  5. Dilated pulmonary artery measuring 32 mm.   Electronically Signed   By: Tobias Alexander   On: 04/01/2018 22:57       CLINICAL DATA:  72 year old female with history of severe aortic stenosis. Preprocedural study prior to potential transcatheter aortic valve replacement (TAVR).  EXAM: CT ANGIOGRAPHY CHEST, ABDOMEN AND PELVIS  TECHNIQUE: Multidetector CT imaging through the chest, abdomen and pelvis was performed using the standard protocol during bolus administration of intravenous contrast. Multiplanar reconstructed images and MIPs were obtained and reviewed to evaluate the vascular anatomy.  CONTRAST:  ISOVUE-370 IOPAMIDOL (ISOVUE-370) INJECTION 76%  COMPARISON:  No priors.  FINDINGS: CTA CHEST FINDINGS  Cardiovascular: Heart size is mildly enlarged with left atrial dilatation. Filling defect within the tip of the left atrial appendage (axial image 40 of series 14), concerning for potential left atrial appendage thrombus. There is aortic atherosclerosis, as well as atherosclerosis of the great vessels of the mediastinum and the coronary arteries, including calcified atherosclerotic plaque in the left main, left anterior descending, left circumflex and right coronary arteries. Status post median sternotomy for CABG including LIMA to the LAD. Severe thickening calcification of the aortic valve. Mild calcifications of  the mitral annulus and valve.  Mediastinum/Lymph Nodes: No pathologically enlarged mediastinal or hilar lymph nodes. Esophagus is unremarkable in appearance. No axillary lymphadenopathy. Large heterogeneously enhancing nodule extending anteriorly from the isthmus of the thyroid gland measuring 3.3 x 2.9 cm.  Lungs/Pleura: No suspicious appearing pulmonary nodules or masses. No acute consolidative airspace disease. No pleural effusions.  Musculoskeletal/Soft Tissues: Median sternotomy wires. There are no aggressive appearing lytic or blastic lesions noted in the visualized portions of the skeleton.  CTA ABDOMEN AND PELVIS FINDINGS  Hepatobiliary: There are 2 lesions in the liver, both in the right lobe, largest of which measures 2.4 x 1.6 cm predominantly in segment 8 of the liver (axial image 230 of series 13). Both of these lesions appear centrally low-attenuation with peripheral nodular enhancement, suggestive of cavernous hemangiomas, but incompletely characterized on today's arterial phase examination. No other hepatic lesions. No intra or extrahepatic biliary ductal dilatation. Small calcified gallstones measuring up to 6 mm lying dependently in the gallbladder. No  surrounding inflammatory changes are noted to suggest an acute cholecystitis at this time.  Pancreas: No pancreatic mass. No pancreatic ductal dilatation. No pancreatic or peripancreatic fluid or inflammatory changes.  Spleen: Unremarkable.  Adrenals/Urinary Tract: 1.7 cm simple cyst in the interpolar region of the right kidney. Severe multifocal cortical thinning throughout the kidneys bilaterally. No suspicious renal lesions. No hydroureteronephrosis. Urinary bladder is normal in appearance. Bilateral adrenal glands are normal in appearance.  Stomach/Bowel: Normal appearance of the stomach. No pathologic dilatation of small bowel or colon. The appendix is not confidently identified and may be  surgically absent. Regardless, there are no inflammatory changes noted adjacent to the cecum to suggest the presence of an acute appendicitis at this time.  Vascular/Lymphatic: Extensive aortic atherosclerosis with vascular findings and measurements pertinent to potential TAVR procedure, as detailed below. Celiac axis, superior mesenteric artery and inferior mesenteric artery are all widely patent without hemodynamically significant stenosis. Single right and 2 left renal arteries are all widely patent without hemodynamically significant stenosis. No lymphadenopathy noted in the abdomen or pelvis.  Reproductive: Uterus and ovaries are unremarkable in appearance.  Other: No significant volume of ascites.  No pneumoperitoneum.  Musculoskeletal: There are no aggressive appearing lytic or blastic lesions noted in the visualized portions of the skeleton.  VASCULAR MEASUREMENTS PERTINENT TO TAVR:  AORTA:  Minimal Aortic Diameter-9 x 12 mm  Severity of Aortic Calcification-severe  RIGHT PELVIS:  Right Common Iliac Artery -  Minimal Diameter-10.9 x 9.5 mm  Tortuosity-mild  Calcification-severe  Right External Iliac Artery -  Minimal Diameter-5.4 x 5.8 mm  Tortuosity-mild  Calcification-mild  Right Common Femoral Artery -  Minimal Diameter-6.2 x 5.5 mm  Tortuosity-mild  Calcification-mild  LEFT PELVIS:  Left Common Iliac Artery -  Minimal Diameter-8.5 x 3.9 mm  Tortuosity-mild  Calcification-severe  Left External Iliac Artery -  Minimal Diameter-6.8 x 6.8 mm  Tortuosity-mild  Calcification-mild  Left Common Femoral Artery -  Minimal Diameter-6.5 x 5.5 mm  Tortuosity-mild  Calcification-mild-to-moderate  Review of the MIP images confirms the above findings.  IMPRESSION: 1. Vascular findings and measurements pertinent to potential TAVR procedure, as detailed above. 2. Severe thickening calcification of the  aortic valve, compatible with the reported clinical history of severe aortic stenosis. 3. Cardiomegaly with left atrial dilatation. Notably, there is a filling defect in the tip of the left atrial appendage concerning for potential left atrial appendage thrombus (although this could simply reflect so-called "pseudo thrombus", related to incomplete mixing of contrast). Correlation with transesophageal echocardiography is suggested if not already recently obtained. 4. Aortic atherosclerosis, in addition to left main and 3 vessel coronary artery disease. Status post median sternotomy for CABG including LIMA to the LAD. 5. Heterogeneously enhancing nodule extending anteriorly from the isthmus of the thyroid gland measuring 3.3 x 2.9 cm. Further evaluation with nonemergent thyroid ultrasound is strongly recommended in the near future to determine better evaluate this lesion and determine the need for potential fine-needle aspiration. 6. 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 These are incompletely evaluated on today's arterial phase examination and could be definitively characterized with MRI of the abdomen with and without IV gadolinium. 7. Cholelithiasis without evidence of acute cholecystitis at this time. 8. Additional incidental findings, as above.  Aortic Atherosclerosis (ICD10-I70.0).   Electronically Signed   By: Trudie Reed M.D.   On: 04/01/2018 11:04   Impression:  This 72 year old woman has stage D, severe, symptomatic aortic stenosis with [place]  Heart Association functional class II symptoms of exertional fatigue and shortness of breath consistent with chronic diastolic congestive heart failure.  Her left ventricular ejection fraction was noted to be 55 to 60% by echocardiogram on 03/04/2018 but has now decreased to 30 to 35% on her most recent echo dated 03/24/2018 suggesting that there is now a component of  chronic systolic congestive heart failure.  I have personally reviewed her echocardiogram, cardiac catheterization, and CTA studies.  Her echocardiogram shows a possibly bicuspid aortic valve that is severely calcified and thickened with restricted mobility.  The mean gradient is 47 mmHg consistent with severe aortic stenosis.  Cardiac catheterization shows severe left main and native three-vessel coronary disease with a high-grade left main, proximal LAD, and ramus stenosis and total occlusion of the right coronary artery.  She has a patent left internal mammary graft to the LAD but her prior vein grafts appear occluded.  There were no graft markers placed at her surgery.  She has not had any chest discomfort and PCI of her coronary stenoses would be quite complex so it may be best just to continue medical therapy at this time.  Her operative mortality for open surgical aortic valve replacement would be at least moderately elevated due to her prior coronary bypass surgery, advanced age, stage III-IV chronic kidney disease with a creatinine of 1.5-1.6 and other comorbid factors including reduced mobility and a sedentary lifestyle.  I think transcatheter aortic valve replacement would be a reasonable alternative for her with continued medical treatment of her complex coronary disease.  Her gated cardiac CTA shows anatomy that is favorable for transcatheter aortic valve replacement.  She does have mild calcification extending into the LVOT under the left and noncoronary cusps but I do not expect that to cause much problem.  Her CTA of the abdomen and pelvis shows adequate vasculature to allow transfemoral insertion from the right side.  She does have severe calcification and narrowing of the left common iliac artery but I think there is an adequate lumen to allow insertion of a diagnostic catheter on that side.  There was a question of a left atrial thrombus on her CTA of the chest but her gated cardiac CTA does not  show any thrombus in the left atrial appendage.  Her chest CT also shows a heterogeneously enhancing nodule in the anterior isthmus of the thyroid gland measuring 3.3 x 2.9 cm.  She has a history of thyroid nodules and prior ultrasounds in the past will probably need a follow-up ultrasound to further evaluate this nodule.  This can be done postoperatively.  The patient and her two sisters were counseled at length regarding treatment alternatives for management of severe symptomatic aortic stenosis. The risks and benefits of surgical intervention has been discussed in detail. Long-term prognosis with medical therapy was discussed. Alternative approaches such as conventional surgical aortic valve replacement, transcatheter aortic valve replacement, and palliative medical therapy were compared and contrasted at length. This discussion was placed in the context of the patient's own specific clinical presentation and past medical history. All of their questions have been addressed.   Following the decision to proceed with transcatheter aortic valve replacement, a discussion was held regarding what types of management strategies would be attempted intraoperatively in the event of life-threatening complications, including whether or not the patient would be considered a candidate for the use of cardiopulmonary bypass and/or conversion to open sternotomy for attempted surgical intervention.   The patient has been advised of a  variety of complications that might develop including but not limited to risks of death, stroke, paravalvular leak, aortic dissection or other major vascular complications, aortic annulus rupture, device embolization, cardiac rupture or perforation, mitral regurgitation, acute myocardial infarction, arrhythmia, heart block or bradycardia requiring permanent pacemaker placement, congestive heart failure, respiratory failure, renal failure, pneumonia, infection, other late complications related to  structural valve deterioration or migration, or other complications that might ultimately cause a temporary or permanent loss of functional independence or other long term morbidity. The patient provides full informed consent for the procedure as described and all questions were answered.     Plan:  Right transfemoral transcatheter aortic valve replacement will tentatively be scheduled on 05/11/2018.   I spent 60 minutes performing this consultation and > 50% of this time was spent face to face counseling and coordinating the care of this patient's severe, symptomatic aortic stenosis.  Alleen Borne, MD 04/21/2018

## 2018-05-03 ENCOUNTER — Other Ambulatory Visit: Payer: Self-pay

## 2018-05-03 DIAGNOSIS — I350 Nonrheumatic aortic (valve) stenosis: Secondary | ICD-10-CM

## 2018-05-06 NOTE — Pre-Procedure Instructions (Signed)
Sydney Ingram  05/06/2018      [facility] - [place], [place] - [address] [place] [address] Phone: [phone] Fax: [phone]    Your procedure is scheduled on  05/11/18 Tuesday   Report to [facility] Admitting at 530 A.M.  Call this number if you have problems the morning of surgery:  [phone]   Remember:  No food or drink after midnight.    Take these medicines the morning of surgery with A SIP OF WATER -    7 days prior to surgery STOP taking any Aspirin(unless otherwise instructed by your surgeon), Aleve, Naproxen, Ibuprofen, Motrin, Advil, Goody's, BC's, all herbal medications, fish oil, and all vitamins     How to Manage Your Diabetes Before and After Surgery  Why is it important to control my blood sugar before and after surgery? . Improving blood sugar levels before and after surgery helps healing and can limit problems. . A way of improving blood sugar control is eating a healthy diet by: o  Eating less sugar and carbohydrates o  Increasing activity/exercise o  Talking with your doctor about reaching your blood sugar goals . High blood sugars (greater than 180 mg/dL) can raise your risk of infections and slow your recovery, so you will need to focus on controlling your diabetes during the weeks before surgery. . Make sure that the doctor who takes care of your diabetes knows about your planned surgery including the date and location.  How do I manage my blood sugar before surgery? . Check your blood sugar at least 4 times a day, starting 2 days before surgery, to make sure that the level is not too high or low. o Check your blood sugar the morning of your surgery when you wake up and every 2 hours until you get to the Short Stay unit. . If your blood sugar is less than 70 mg/dL, you will need to treat for low blood sugar: o Do not take insulin. o Treat a low blood sugar (less than 70 mg/dL) with  cup of  clear juice (cranberry or apple), 4 glucose tablets, OR glucose gel. Recheck blood sugar in 15 minutes after treatment (to make sure it is greater than 70 mg/dL). If your blood sugar is not greater than 70 mg/dL on recheck, call [phone] o  for further instructions. . Report your blood sugar to the short stay nurse when you get to Short Stay.  . If you are admitted to the hospital after surgery: o Your blood sugar will be checked by the staff and you will probably be given insulin after surgery (instead of oral diabetes medicines) to make sure you have good blood sugar levels. o The goal for blood sugar control after surgery is 80-180 mg/dL.              WHAT DO I DO ABOUT MY DIABETES MEDICATION?   Marland Kitchen Do not take oral diabetes medicines (pills) the morning of surgery.   Other Instructions:          Patient Signature:  Date:   Nurse Signature:  Date:   Reviewed and Endorsed by [facility] Patient Education Committee, August 2015  Do not wear jewelry, make-up or nail polish.  Do not wear lotions, powders, or perfumes, or deodorant.  Do not shave 48 hours prior to surgery.  Men may shave face and neck.  Do not bring valuables to the hospital.  [facility] is not responsible for any belongings or valuables.  Contacts, dentures or bridgework may not be worn into surgery.  Leave your suitcase in the car.  After surgery it may be brought to your room.  For patients admitted to the hospital, discharge time will be determined by your treatment team.  Patients discharged the day of surgery will not be allowed to drive home.   Name and phone number of your driver:    Special instructions:  [facility] - Preparing for Surgery  Before surgery, you can play an important role.  Because skin is not sterile, your skin needs to be as free of germs as possible.  You can reduce the number of germs on you skin by washing with CHG (chlorahexidine gluconate) soap before surgery.  CHG  is an antiseptic cleaner which kills germs and bonds with the skin to continue killing germs even after washing.  Oral Hygiene is also important in reducing the risk of infection.  Remember to brush your teeth with your regular toothpaste the morning of surgery.  Please DO NOT use if you have an allergy to CHG or antibacterial soaps.  If your skin becomes reddened/irritated stop using the CHG and inform your nurse when you arrive at Short Stay.  Do not shave (including legs and underarms) for at least 48 hours prior to the first CHG shower.  You may shave your face.  Please follow these instructions carefully:   1.  Shower with CHG Soap the night before surgery and the morning of Surgery.  2.  If you choose to wash your hair, wash your hair first as usual with your normal shampoo.  3.  After you shampoo, rinse your hair and body thoroughly to remove the shampoo. 4.  Use CHG as you would any other liquid soap.  You can apply chg directly to the skin and wash gently with a      scrungie or washcloth.           5.  Apply the CHG Soap to your body ONLY FROM THE NECK DOWN.   Do not use on open wounds or open sores. Avoid contact with your eyes, ears, mouth and genitals (private parts).  Wash genitals (private parts) with your normal soap.  6.  Wash thoroughly, paying special attention to the area where your surgery will be performed.  7.  Thoroughly rinse your body with warm water from the neck down.  8.  DO NOT shower/wash with your normal soap after using and rinsing off the CHG Soap.  9.  Pat yourself dry with a clean towel.            10.  Wear clean pajamas.            11.  Place clean sheets on your bed the night of your first shower and do not sleep with pets.  Day of Surgery  Do not apply any lotions/deoderants the morning of surgery.   Please wear clean clothes to the hospital/surgery center. Remember to brush your teeth with toothpaste.     Please read over the following fact  sheets that you were given. MRSA Information and Surgical Site Infection Prevention

## 2018-05-07 ENCOUNTER — Encounter (HOSPITAL_COMMUNITY)
Admission: RE | Admit: 2018-05-07 | Discharge: 2018-05-07 | Disposition: A | Discharge status: Home / Self Care | Payer: Medicare Other | Source: Ambulatory Visit | Attending: Cardiovascular Disease | Admitting: Cardiovascular Disease

## 2018-05-07 ENCOUNTER — Other Ambulatory Visit: Payer: Self-pay

## 2018-05-07 ENCOUNTER — Encounter (HOSPITAL_COMMUNITY): Payer: Self-pay

## 2018-05-07 ENCOUNTER — Ambulatory Visit (HOSPITAL_COMMUNITY)
Admission: RE | Admit: 2018-05-07 | Discharge: 2018-05-07 | Disposition: A | Discharge status: Home / Self Care | Payer: Medicare Other | Source: Ambulatory Visit | Attending: Cardiovascular Disease | Admitting: Cardiovascular Disease

## 2018-05-07 DIAGNOSIS — I700 Atherosclerosis of aorta: Secondary | ICD-10-CM | POA: Insufficient documentation

## 2018-05-07 DIAGNOSIS — I2510 Atherosclerotic heart disease of native coronary artery without angina pectoris: Secondary | ICD-10-CM | POA: Insufficient documentation

## 2018-05-07 DIAGNOSIS — Z951 Presence of aortocoronary bypass graft: Secondary | ICD-10-CM | POA: Diagnosis not present

## 2018-05-07 DIAGNOSIS — E1122 Type 2 diabetes mellitus with diabetic chronic kidney disease: Secondary | ICD-10-CM | POA: Insufficient documentation

## 2018-05-07 DIAGNOSIS — K219 Gastro-esophageal reflux disease without esophagitis: Secondary | ICD-10-CM | POA: Diagnosis not present

## 2018-05-07 DIAGNOSIS — Z01818 Encounter for other preprocedural examination: Secondary | ICD-10-CM | POA: Diagnosis present

## 2018-05-07 DIAGNOSIS — E785 Hyperlipidemia, unspecified: Secondary | ICD-10-CM | POA: Diagnosis not present

## 2018-05-07 DIAGNOSIS — I130 Hypertensive heart and chronic kidney disease with heart failure and stage 1 through stage 4 chronic kidney disease, or unspecified chronic kidney disease: Secondary | ICD-10-CM | POA: Insufficient documentation

## 2018-05-07 DIAGNOSIS — Z7982 Long term (current) use of aspirin: Secondary | ICD-10-CM | POA: Diagnosis not present

## 2018-05-07 DIAGNOSIS — E1151 Type 2 diabetes mellitus with diabetic peripheral angiopathy without gangrene: Secondary | ICD-10-CM | POA: Diagnosis not present

## 2018-05-07 DIAGNOSIS — Z7984 Long term (current) use of oral hypoglycemic drugs: Secondary | ICD-10-CM | POA: Diagnosis not present

## 2018-05-07 DIAGNOSIS — N189 Chronic kidney disease, unspecified: Secondary | ICD-10-CM | POA: Diagnosis not present

## 2018-05-07 DIAGNOSIS — I5042 Chronic combined systolic (congestive) and diastolic (congestive) heart failure: Secondary | ICD-10-CM | POA: Diagnosis not present

## 2018-05-07 DIAGNOSIS — I350 Nonrheumatic aortic (valve) stenosis: Secondary | ICD-10-CM

## 2018-05-07 DIAGNOSIS — I491 Atrial premature depolarization: Secondary | ICD-10-CM | POA: Insufficient documentation

## 2018-05-07 DIAGNOSIS — Z79899 Other long term (current) drug therapy: Secondary | ICD-10-CM | POA: Insufficient documentation

## 2018-05-07 DIAGNOSIS — Z7902 Long term (current) use of antithrombotics/antiplatelets: Secondary | ICD-10-CM | POA: Insufficient documentation

## 2018-05-07 DIAGNOSIS — Z01810 Encounter for preprocedural cardiovascular examination: Secondary | ICD-10-CM | POA: Diagnosis not present

## 2018-05-07 LAB — URINALYSIS, ROUTINE W REFLEX MICROSCOPIC
Bilirubin Urine: NEGATIVE
GLUCOSE, UA: NEGATIVE mg/dL
Hgb urine dipstick: NEGATIVE
KETONES UR: NEGATIVE mg/dL
Nitrite: NEGATIVE
PH: 5 (ref 5.0–8.0)
Protein, ur: 30 mg/dL — AB
SPECIFIC GRAVITY, URINE: 1.008 (ref 1.005–1.030)

## 2018-05-07 LAB — COMPREHENSIVE METABOLIC PANEL
ALT: 14 U/L (ref 14–54)
AST: 21 U/L (ref 15–41)
Albumin: 3.2 g/dL — ABNORMAL LOW (ref 3.5–5.0)
Alkaline Phosphatase: 67 U/L (ref 38–126)
Anion gap: 10 (ref 5–15)
BILIRUBIN TOTAL: 0.5 mg/dL (ref 0.3–1.2)
BUN: 25 mg/dL — AB (ref 6–20)
CHLORIDE: 106 mmol/L (ref 101–111)
CO2: 22 mmol/L (ref 22–32)
CREATININE: 1.68 mg/dL — AB (ref 0.44–1.00)
Calcium: 8.9 mg/dL (ref 8.9–10.3)
GFR calc Af Amer: 34 mL/min — ABNORMAL LOW (ref >60)
GFR, EST NON AFRICAN AMERICAN: 30 mL/min — AB (ref >60)
Glucose, Bld: 142 mg/dL — ABNORMAL HIGH (ref 65–99)
Potassium: 4 mmol/L (ref 3.5–5.1)
Sodium: 138 mmol/L (ref 135–145)
Total Protein: 7.6 g/dL (ref 6.5–8.1)

## 2018-05-07 LAB — ABO/RH: ABO/RH(D): O NEG

## 2018-05-07 LAB — SURGICAL PCR SCREEN
MRSA, PCR: NEGATIVE
Staphylococcus aureus: NEGATIVE

## 2018-05-07 LAB — GLUCOSE, CAPILLARY: GLUCOSE-CAPILLARY: 129 mg/dL — AB (ref 65–99)

## 2018-05-07 LAB — PROTIME-INR
INR: 1.23
Prothrombin Time: 15.4 seconds — ABNORMAL HIGH (ref 11.4–15.2)

## 2018-05-07 LAB — HEMOGLOBIN A1C
HEMOGLOBIN A1C: 5.4 % (ref 4.8–5.6)
Mean Plasma Glucose: 108.28 mg/dL

## 2018-05-07 LAB — CBC
HEMATOCRIT: 33.2 % — AB (ref 36.0–46.0)
Hemoglobin: 10.3 g/dL — ABNORMAL LOW (ref 12.0–15.0)
MCH: 27.3 pg (ref 26.0–34.0)
MCHC: 31 g/dL (ref 30.0–36.0)
MCV: 88.1 fL (ref 78.0–100.0)
PLATELETS: 151 10*3/uL (ref 150–400)
RBC: 3.77 MIL/uL — ABNORMAL LOW (ref 3.87–5.11)
RDW: 14.3 % (ref 11.5–15.5)
WBC: 5.4 10*3/uL (ref 4.0–10.5)

## 2018-05-07 LAB — BLOOD GAS, ARTERIAL
ACID-BASE EXCESS: 0.4 mmol/L (ref 0.0–2.0)
BICARBONATE: 24.3 mmol/L (ref 20.0–28.0)
Drawn by: 421801
FIO2: 21
O2 SAT: 97.3 %
PATIENT TEMPERATURE: 98.6
PO2 ART: 94.7 mmHg (ref 83.0–108.0)
pCO2 arterial: 37.9 mmHg (ref 32.0–48.0)
pH, Arterial: 7.423 (ref 7.350–7.450)

## 2018-05-07 LAB — TYPE AND SCREEN
ABO/RH(D): O NEG
Antibody Screen: NEGATIVE

## 2018-05-07 LAB — APTT: APTT: 34 s (ref 24–36)

## 2018-05-07 LAB — BRAIN NATRIURETIC PEPTIDE: B Natriuretic Peptide: 1702.7 pg/mL — ABNORMAL HIGH (ref 0.0–100.0)

## 2018-05-07 NOTE — Progress Notes (Addendum)
PCP: Dr. Lollie Marrow @ [facility] in [facility]  Cardiologist: [facility] -pt. Doesn't know his name  Spoke with Laren (TAVR) nurse, stated pt. Is to continue coreg, asprin, and plavix, not to take any meds the day of surgery.  Pt. Doesn't check blood sugars  Discussed pre-op instructions with patient and sister, Hattie. Pt. Doesn't remember well. Sister verbalized understanding these instructions.

## 2018-05-07 NOTE — Pre-Procedure Instructions (Signed)
Sydney Ingram  05/07/2018      [facility] - [place], [place] - [address] [place] [address] Phone: [phone] Fax: [phone]    Your procedure is scheduled on  05/11/18 Tuesday   Report to [facility] Admitting at 530 A.M.  Call this number if you have problems the morning of surgery:  [phone]   Remember:  No food or drink after midnight.                 Take these medicines the morning of surgery with A SIP OF WATER : NONE   7 days prior to surgery STOP taking  Aleve, Naproxen, Ibuprofen, Motrin, Advil, Goody's, BC's, all herbal medications, fish oil, and all vitamins     How to Manage Your Diabetes Before and After Surgery  Why is it important to control my blood sugar before and after surgery? . Improving blood sugar levels before and after surgery helps healing and can limit problems. . A way of improving blood sugar control is eating a healthy diet by: o  Eating less sugar and carbohydrates o  Increasing activity/exercise o  Talking with your doctor about reaching your blood sugar goals . High blood sugars (greater than 180 mg/dL) can raise your risk of infections and slow your recovery, so you will need to focus on controlling your diabetes during the weeks before surgery. . Make sure that the doctor who takes care of your diabetes knows about your planned surgery including the date and location.  How do I manage my blood sugar before surgery? . Check your blood sugar at least 4 times a day, starting 2 days before surgery, to make sure that the level is not too high or low. o Check your blood sugar the morning of your surgery when you wake up and every 2 hours until you get to the Short Stay unit. . If your blood sugar is less than 70 mg/dL, you will need to treat for low blood sugar: o Do not take insulin. o Treat a low blood sugar (less than 70 mg/dL) with  cup of clear juice (cranberry or apple), 4  glucose tablets, OR glucose gel. Recheck blood sugar in 15 minutes after treatment (to make sure it is greater than 70 mg/dL). If your blood sugar is not greater than 70 mg/dL on recheck, call [phone] o  for further instructions. . Report your blood sugar to the short stay nurse when you get to Short Stay.  . If you are admitted to the hospital after surgery: o Your blood sugar will be checked by the staff and you will probably be given insulin after surgery (instead of oral diabetes medicines) to make sure you have good blood sugar levels. o The goal for blood sugar control after surgery is 80-180 mg/dL.       WHAT DO I DO ABOUT MY DIABETES MEDICATION?   Marland Kitchen Do not take oral diabetes medicines (pills) the morning of surgery.     Do not wear jewelry, make-up or nail polish.  Do not wear lotions, powders, or perfumes, or deodorant.  Do not shave 48 hours prior to surgery.  Men may shave face and neck.  Do not bring valuables to the hospital.  [facility] is not responsible for any belongings or valuables.  Contacts, dentures or bridgework may not be worn into surgery.  Leave your suitcase in the car.  After surgery it may  be brought to your room.  For patients admitted to the hospital, discharge time will be determined by your treatment team.  Patients discharged the day of surgery will not be allowed to drive home.      Special instructions:  [place] - Preparing for Surgery  Before surgery, you can play an important role.  Because skin is not sterile, your skin needs to be as free of germs as possible.  You can reduce the number of germs on you skin by washing with CHG (chlorahexidine gluconate) soap before surgery.  CHG is an antiseptic cleaner which kills germs and bonds with the skin to continue killing germs even after washing.  Oral Hygiene is also important in reducing the risk of infection.  Remember to brush your teeth with your regular toothpaste the morning of  surgery.  Please DO NOT use if you have an allergy to CHG or antibacterial soaps.  If your skin becomes reddened/irritated stop using the CHG and inform your nurse when you arrive at Short Stay.  Do not shave (including legs and underarms) for at least 48 hours prior to the first CHG shower.  You may shave your face.  Please follow these instructions carefully:   1.  Shower with CHG Soap the night before surgery and the morning of Surgery.  2.  If you choose to wash your hair, wash your hair first as usual with your normal shampoo.  3.  After you shampoo, rinse your hair and body thoroughly to remove the shampoo. 4.  Use CHG as you would any other liquid soap.  You can apply chg directly to the skin and wash gently with a      scrungie or washcloth.           5.  Apply the CHG Soap to your body ONLY FROM THE NECK DOWN.   Do not use on open wounds or open sores. Avoid contact with your eyes, ears, mouth and genitals (private parts).  Wash genitals (private parts) with your normal soap.  6.  Wash thoroughly, paying special attention to the area where your surgery will be performed.  7.  Thoroughly rinse your body with warm water from the neck down.  8.  DO NOT shower/wash with your normal soap after using and rinsing off the CHG Soap.  9.  Pat yourself dry with a clean towel.            10.  Wear clean pajamas.            11.  Place clean sheets on your bed the night of your first shower and do not sleep with pets.  Day of Surgery  Do not apply any lotions/deoderants the morning of surgery.   Please wear clean clothes to the hospital/surgery center. Remember to brush your teeth with toothpaste.     Please read over the following fact sheets that you were given. MRSA Information and Surgical Site Infection Prevention

## 2018-05-09 MED ORDER — DOPAMINE-DEXTROSE 3.2-5 MG/ML-% IV SOLN
0.0000 ug/kg/min | INTRAVENOUS | Status: DC
  Filled 2018-05-09: qty 250

## 2018-05-09 MED ORDER — EPINEPHRINE PF 1 MG/ML IJ SOLN
0.0000 ug/min | INTRAMUSCULAR | Status: DC
  Filled 2018-05-09: qty 4

## 2018-05-09 MED ORDER — DEXTROSE 5 % IV SOLN
0.0000 ug/min | INTRAVENOUS | Status: DC
  Filled 2018-05-09: qty 4

## 2018-05-09 MED ORDER — MAGNESIUM SULFATE 50 % IJ SOLN
40.0000 meq | INTRAMUSCULAR | Status: DC
  Filled 2018-05-09: qty 9.85

## 2018-05-09 MED ORDER — VANCOMYCIN HCL 10 G IV SOLR
1250.0000 mg | INTRAVENOUS | Status: AC
  Administered 2018-05-11: 1250 mg via INTRAVENOUS
  Filled 2018-05-09: qty 1250

## 2018-05-09 MED ORDER — SODIUM CHLORIDE 0.9 % IV SOLN
INTRAVENOUS | Status: DC
  Filled 2018-05-09: qty 30

## 2018-05-09 MED ORDER — DEXMEDETOMIDINE HCL IN NACL 400 MCG/100ML IV SOLN
0.1000 ug/kg/h | INTRAVENOUS | Status: AC
  Administered 2018-05-11: .5 ug/kg/h via INTRAVENOUS
  Filled 2018-05-09: qty 100

## 2018-05-09 MED ORDER — NITROGLYCERIN IN D5W 200-5 MCG/ML-% IV SOLN
2.0000 ug/min | INTRAVENOUS | Status: DC
  Filled 2018-05-09: qty 250

## 2018-05-09 MED ORDER — POTASSIUM CHLORIDE 2 MEQ/ML IV SOLN
80.0000 meq | INTRAVENOUS | Status: DC
  Filled 2018-05-09: qty 40

## 2018-05-09 MED ORDER — SODIUM CHLORIDE 0.9 % IV SOLN
INTRAVENOUS | Status: DC
  Filled 2018-05-09: qty 1

## 2018-05-09 MED ORDER — SODIUM CHLORIDE 0.9 % IV SOLN
30.0000 ug/min | INTRAVENOUS | Status: DC
  Filled 2018-05-09: qty 2

## 2018-05-09 MED ORDER — LEVOFLOXACIN IN D5W 500 MG/100ML IV SOLN
500.0000 mg | INTRAVENOUS | Status: AC
  Administered 2018-05-11: 500 mg via INTRAVENOUS
  Filled 2018-05-09: qty 100

## 2018-05-10 NOTE — Anesthesia Preprocedure Evaluation (Addendum)
Anesthesia Evaluation  Patient identified by MRN, date of birth, ID band Patient awake    Reviewed: Allergy & Precautions, H&P , NPO status , Patient's Chart, lab work & pertinent test results, reviewed documented beta blocker date and time   Airway Mallampati: II  TM Distance: >3 FB Neck ROM: Full    Dental no notable dental hx. (+) Teeth Intact, Dental Advisory Given   Pulmonary neg pulmonary ROS,    Pulmonary exam normal breath sounds clear to auscultation       Cardiovascular Exercise Tolerance: Good hypertension, Pt. on medications and Pt. on home beta blockers + CAD, + Cardiac Stents, + CABG, + Peripheral Vascular Disease and +CHF   Rhythm:Irregular Rate:Normal + Systolic murmurs    Neuro/Psych negative neurological ROS  negative psych ROS   GI/Hepatic Neg liver ROS, GERD  Medicated and Controlled,  Endo/Other  negative endocrine ROSdiabetes, Type 2, Oral Hypoglycemic Agents  Renal/GU Renal InsufficiencyRenal disease  negative genitourinary   Musculoskeletal  (+) Arthritis , Osteoarthritis,    Abdominal   Peds  Hematology negative hematology ROS (+)   Anesthesia Other Findings   Reproductive/Obstetrics negative OB ROS                            Anesthesia Physical Anesthesia Plan  ASA: IV  Anesthesia Plan: MAC   Post-op Pain Management:    Induction: Intravenous  PONV Risk Score and Plan: 2 and Propofol infusion, Ondansetron and Treatment may vary due to age or medical condition  Airway Management Planned: Simple Face Mask  Additional Equipment: Arterial line, CVP and Ultrasound Guidance Line Placement  Intra-op Plan:   Post-operative Plan:   Informed Consent: I have reviewed the patients History and Physical, chart, labs and discussed the procedure including the risks, benefits and alternatives for the proposed anesthesia with the patient or authorized representative  who has indicated his/her understanding and acceptance.   Dental advisory given  Plan Discussed with: CRNA  Anesthesia Plan Comments:         Anesthesia Quick Evaluation

## 2018-05-11 ENCOUNTER — Inpatient Hospital Stay (HOSPITAL_COMMUNITY): Payer: Medicare Other | Admitting: Emergency Medicine

## 2018-05-11 ENCOUNTER — Inpatient Hospital Stay (HOSPITAL_COMMUNITY): Payer: Medicare Other

## 2018-05-11 ENCOUNTER — Encounter (HOSPITAL_COMMUNITY): Payer: Self-pay | Admitting: *Deleted

## 2018-05-11 ENCOUNTER — Encounter (HOSPITAL_COMMUNITY)
Admission: RE | Disposition: A | Discharge status: Home / Self Care | Payer: Self-pay | Source: Ambulatory Visit | Attending: Cardiovascular Disease

## 2018-05-11 ENCOUNTER — Inpatient Hospital Stay (HOSPITAL_COMMUNITY)
Admission: RE | Admit: 2018-05-11 | Discharge: 2018-05-12 | DRG: 266 | Disposition: A | Discharge status: Home / Self Care | Payer: Medicare Other | Source: Ambulatory Visit | Attending: Cardiovascular Disease | Admitting: Cardiovascular Disease

## 2018-05-11 ENCOUNTER — Inpatient Hospital Stay (HOSPITAL_COMMUNITY): Payer: Medicare Other | Admitting: Anesthesiology

## 2018-05-11 DIAGNOSIS — I5032 Chronic diastolic (congestive) heart failure: Secondary | ICD-10-CM | POA: Diagnosis present

## 2018-05-11 DIAGNOSIS — N183 Chronic kidney disease, stage 3 unspecified: Secondary | ICD-10-CM | POA: Diagnosis present

## 2018-05-11 DIAGNOSIS — I2510 Atherosclerotic heart disease of native coronary artery without angina pectoris: Secondary | ICD-10-CM | POA: Diagnosis present

## 2018-05-11 DIAGNOSIS — K219 Gastro-esophageal reflux disease without esophagitis: Secondary | ICD-10-CM | POA: Diagnosis present

## 2018-05-11 DIAGNOSIS — M1990 Unspecified osteoarthritis, unspecified site: Secondary | ICD-10-CM | POA: Diagnosis present

## 2018-05-11 DIAGNOSIS — Z880 Allergy status to penicillin: Secondary | ICD-10-CM | POA: Diagnosis not present

## 2018-05-11 DIAGNOSIS — E1122 Type 2 diabetes mellitus with diabetic chronic kidney disease: Secondary | ICD-10-CM | POA: Diagnosis present

## 2018-05-11 DIAGNOSIS — Z833 Family history of diabetes mellitus: Secondary | ICD-10-CM | POA: Diagnosis not present

## 2018-05-11 DIAGNOSIS — I350 Nonrheumatic aortic (valve) stenosis: Secondary | ICD-10-CM

## 2018-05-11 DIAGNOSIS — Z90710 Acquired absence of both cervix and uterus: Secondary | ICD-10-CM | POA: Diagnosis not present

## 2018-05-11 DIAGNOSIS — E119 Type 2 diabetes mellitus without complications: Secondary | ICD-10-CM

## 2018-05-11 DIAGNOSIS — Z7902 Long term (current) use of antithrombotics/antiplatelets: Secondary | ICD-10-CM | POA: Diagnosis not present

## 2018-05-11 DIAGNOSIS — N184 Chronic kidney disease, stage 4 (severe): Secondary | ICD-10-CM | POA: Diagnosis present

## 2018-05-11 DIAGNOSIS — I5043 Acute on chronic combined systolic (congestive) and diastolic (congestive) heart failure: Secondary | ICD-10-CM | POA: Diagnosis present

## 2018-05-11 DIAGNOSIS — Z8249 Family history of ischemic heart disease and other diseases of the circulatory system: Secondary | ICD-10-CM

## 2018-05-11 DIAGNOSIS — Z951 Presence of aortocoronary bypass graft: Secondary | ICD-10-CM

## 2018-05-11 DIAGNOSIS — I080 Rheumatic disorders of both mitral and aortic valves: Principal | ICD-10-CM | POA: Diagnosis present

## 2018-05-11 DIAGNOSIS — I130 Hypertensive heart and chronic kidney disease with heart failure and stage 1 through stage 4 chronic kidney disease, or unspecified chronic kidney disease: Secondary | ICD-10-CM | POA: Diagnosis present

## 2018-05-11 DIAGNOSIS — I2582 Chronic total occlusion of coronary artery: Secondary | ICD-10-CM | POA: Diagnosis present

## 2018-05-11 DIAGNOSIS — Z7982 Long term (current) use of aspirin: Secondary | ICD-10-CM

## 2018-05-11 DIAGNOSIS — Z7984 Long term (current) use of oral hypoglycemic drugs: Secondary | ICD-10-CM

## 2018-05-11 DIAGNOSIS — I5033 Acute on chronic diastolic (congestive) heart failure: Secondary | ICD-10-CM

## 2018-05-11 DIAGNOSIS — Z006 Encounter for examination for normal comparison and control in clinical research program: Secondary | ICD-10-CM

## 2018-05-11 DIAGNOSIS — M5430 Sciatica, unspecified side: Secondary | ICD-10-CM | POA: Diagnosis present

## 2018-05-11 DIAGNOSIS — I10 Essential (primary) hypertension: Secondary | ICD-10-CM | POA: Diagnosis present

## 2018-05-11 DIAGNOSIS — E785 Hyperlipidemia, unspecified: Secondary | ICD-10-CM | POA: Diagnosis present

## 2018-05-11 DIAGNOSIS — G4700 Insomnia, unspecified: Secondary | ICD-10-CM | POA: Diagnosis present

## 2018-05-11 DIAGNOSIS — I700 Atherosclerosis of aorta: Secondary | ICD-10-CM | POA: Diagnosis present

## 2018-05-11 DIAGNOSIS — I272 Pulmonary hypertension, unspecified: Secondary | ICD-10-CM | POA: Diagnosis present

## 2018-05-11 DIAGNOSIS — K8020 Calculus of gallbladder without cholecystitis without obstruction: Secondary | ICD-10-CM | POA: Diagnosis present

## 2018-05-11 DIAGNOSIS — Z8349 Family history of other endocrine, nutritional and metabolic diseases: Secondary | ICD-10-CM

## 2018-05-11 DIAGNOSIS — Z952 Presence of prosthetic heart valve: Secondary | ICD-10-CM

## 2018-05-11 DIAGNOSIS — E1151 Type 2 diabetes mellitus with diabetic peripheral angiopathy without gangrene: Secondary | ICD-10-CM | POA: Diagnosis present

## 2018-05-11 DIAGNOSIS — Z955 Presence of coronary angioplasty implant and graft: Secondary | ICD-10-CM

## 2018-05-11 HISTORY — DX: Chronic combined systolic (congestive) and diastolic (congestive) heart failure: I50.42

## 2018-05-11 HISTORY — PX: TRANSCATHETER AORTIC VALVE REPLACEMENT, TRANSFEMORAL: SHX6400

## 2018-05-11 HISTORY — PX: INTRAOPERATIVE TRANSTHORACIC ECHOCARDIOGRAM: SHX6523

## 2018-05-11 HISTORY — DX: Nonrheumatic aortic (valve) stenosis: I35.0

## 2018-05-11 HISTORY — DX: Presence of prosthetic heart valve: Z95.2

## 2018-05-11 LAB — POCT I-STAT, CHEM 8
BUN: 25 mg/dL — AB (ref 6–20)
CREATININE: 1.6 mg/dL — AB (ref 0.44–1.00)
Calcium, Ion: 1.24 mmol/L (ref 1.15–1.40)
Chloride: 108 mmol/L (ref 101–111)
GLUCOSE: 116 mg/dL — AB (ref 65–99)
HEMATOCRIT: 31 % — AB (ref 36.0–46.0)
HEMOGLOBIN: 10.5 g/dL — AB (ref 12.0–15.0)
POTASSIUM: 3.9 mmol/L (ref 3.5–5.1)
Sodium: 144 mmol/L (ref 135–145)
TCO2: 23 mmol/L (ref 22–32)

## 2018-05-11 LAB — GLUCOSE, CAPILLARY
GLUCOSE-CAPILLARY: 102 mg/dL — AB (ref 65–99)
Glucose-Capillary: 127 mg/dL — ABNORMAL HIGH (ref 65–99)
Glucose-Capillary: 98 mg/dL (ref 65–99)

## 2018-05-11 LAB — POCT I-STAT 4, (NA,K, GLUC, HGB,HCT)
GLUCOSE: 152 mg/dL — AB (ref 65–99)
HCT: 27 % — ABNORMAL LOW (ref 36.0–46.0)
Hemoglobin: 9.2 g/dL — ABNORMAL LOW (ref 12.0–15.0)
Potassium: 4 mmol/L (ref 3.5–5.1)
Sodium: 144 mmol/L (ref 135–145)

## 2018-05-11 LAB — PROTIME-INR
INR: 1.35
PROTHROMBIN TIME: 16.5 s — AB (ref 11.4–15.2)

## 2018-05-11 LAB — APTT: aPTT: 42 seconds — ABNORMAL HIGH (ref 24–36)

## 2018-05-11 SURGERY — IMPLANTATION, AORTIC VALVE, TRANSCATHETER, FEMORAL APPROACH
Anesthesia: Monitor Anesthesia Care | Site: Chest

## 2018-05-11 MED ORDER — CHLORHEXIDINE GLUCONATE 0.12 % MT SOLN
15.0000 mL | Freq: Once | OROMUCOSAL | Status: AC
  Administered 2018-05-11: 15 mL via OROMUCOSAL

## 2018-05-11 MED ORDER — PROTAMINE SULFATE 10 MG/ML IV SOLN
INTRAVENOUS | Status: AC
  Filled 2018-05-11: qty 5

## 2018-05-11 MED ORDER — METOPROLOL TARTRATE 5 MG/5ML IV SOLN: 2.5000 mg | INTRAVENOUS | Status: DC | PRN

## 2018-05-11 MED ORDER — SODIUM CHLORIDE 0.9 % IV SOLN
INTRAVENOUS | Status: DC | PRN
  Administered 2018-05-11: 1500 mL

## 2018-05-11 MED ORDER — TRAMADOL HCL 50 MG PO TABS: 50.0000 mg | ORAL_TABLET | ORAL | Status: DC | PRN

## 2018-05-11 MED ORDER — LACTATED RINGERS IV SOLN: 500.0000 mL | Freq: Once | INTRAVENOUS | Status: DC | PRN

## 2018-05-11 MED ORDER — LEVOFLOXACIN IN D5W 750 MG/150ML IV SOLN
750.0000 mg | INTRAVENOUS | Status: AC
  Administered 2018-05-12: 750 mg via INTRAVENOUS
  Filled 2018-05-11: qty 150

## 2018-05-11 MED ORDER — MIDAZOLAM HCL 2 MG/2ML IJ SOLN
INTRAMUSCULAR | Status: AC
  Filled 2018-05-11: qty 2

## 2018-05-11 MED ORDER — LACTATED RINGERS IV SOLN
INTRAVENOUS | Status: DC | PRN
  Administered 2018-05-11: 07:00:00 via INTRAVENOUS

## 2018-05-11 MED ORDER — CHLORHEXIDINE GLUCONATE 0.12 % MT SOLN
OROMUCOSAL | Status: AC
  Filled 2018-05-11: qty 15

## 2018-05-11 MED ORDER — CHLORHEXIDINE GLUCONATE 4 % EX LIQD: 30.0000 mL | CUTANEOUS | Status: DC

## 2018-05-11 MED ORDER — CHLORHEXIDINE GLUCONATE 4 % EX LIQD: 60.0000 mL | Freq: Once | CUTANEOUS | Status: DC

## 2018-05-11 MED ORDER — HEPARIN SODIUM (PORCINE) 1000 UNIT/ML IJ SOLN
INTRAMUSCULAR | Status: DC | PRN
  Administered 2018-05-11: 9000 [IU] via INTRAVENOUS

## 2018-05-11 MED ORDER — PROTAMINE SULFATE 10 MG/ML IV SOLN
INTRAVENOUS | Status: DC | PRN
  Administered 2018-05-11 (×3): 20 mg via INTRAVENOUS
  Administered 2018-05-11: 30 mg via INTRAVENOUS

## 2018-05-11 MED ORDER — PROPOFOL 1000 MG/100ML IV EMUL
INTRAVENOUS | Status: AC
  Filled 2018-05-11: qty 100

## 2018-05-11 MED ORDER — ONDANSETRON HCL 4 MG/2ML IJ SOLN
INTRAMUSCULAR | Status: AC
  Filled 2018-05-11: qty 2

## 2018-05-11 MED ORDER — IODIXANOL 320 MG/ML IV SOLN
INTRAVENOUS | Status: DC | PRN
  Administered 2018-05-11: 28 mL via INTRAVENOUS

## 2018-05-11 MED ORDER — CLOPIDOGREL BISULFATE 75 MG PO TABS
75.0000 mg | ORAL_TABLET | Freq: Every day | ORAL | Status: DC
  Administered 2018-05-12: 75 mg via ORAL
  Filled 2018-05-11: qty 1

## 2018-05-11 MED ORDER — MORPHINE SULFATE (PF) 2 MG/ML IV SOLN: 2.0000 mg | INTRAVENOUS | Status: DC | PRN

## 2018-05-11 MED ORDER — SODIUM CHLORIDE 0.9 % IV SOLN
0.0000 ug/min | INTRAVENOUS | Status: DC
  Filled 2018-05-11: qty 2

## 2018-05-11 MED ORDER — ATORVASTATIN CALCIUM 20 MG PO TABS
20.0000 mg | ORAL_TABLET | Freq: Every day | ORAL | Status: DC
  Administered 2018-05-11: 20 mg via ORAL
  Filled 2018-05-11: qty 1

## 2018-05-11 MED ORDER — PROPOFOL 10 MG/ML IV BOLUS
INTRAVENOUS | Status: AC
  Filled 2018-05-11: qty 20

## 2018-05-11 MED ORDER — FENTANYL CITRATE (PF) 100 MCG/2ML IJ SOLN
INTRAMUSCULAR | Status: DC | PRN
  Administered 2018-05-11: 50 ug via INTRAVENOUS

## 2018-05-11 MED ORDER — PROPOFOL 500 MG/50ML IV EMUL
INTRAVENOUS | Status: DC | PRN
  Administered 2018-05-11: 25 ug/kg/min via INTRAVENOUS

## 2018-05-11 MED ORDER — INSULIN ASPART 100 UNIT/ML ~~LOC~~ SOLN: 0.0000 [IU] | Freq: Three times a day (TID) | SUBCUTANEOUS | Status: DC

## 2018-05-11 MED ORDER — PANTOPRAZOLE SODIUM 40 MG PO TBEC
40.0000 mg | DELAYED_RELEASE_TABLET | Freq: Every day | ORAL | Status: DC
  Administered 2018-05-12: 40 mg via ORAL
  Filled 2018-05-11: qty 1

## 2018-05-11 MED ORDER — VANCOMYCIN HCL IN DEXTROSE 1-5 GM/200ML-% IV SOLN
1000.0000 mg | Freq: Once | INTRAVENOUS | Status: AC
  Administered 2018-05-11: 1000 mg via INTRAVENOUS
  Filled 2018-05-11: qty 200

## 2018-05-11 MED ORDER — EPHEDRINE SULFATE 50 MG/ML IJ SOLN
INTRAMUSCULAR | Status: AC
  Filled 2018-05-11: qty 1

## 2018-05-11 MED ORDER — SODIUM CHLORIDE 0.9 % IV SOLN
INTRAVENOUS | Status: DC
  Administered 2018-05-11: 10:00:00 via INTRAVENOUS

## 2018-05-11 MED ORDER — OXYCODONE HCL 5 MG PO TABS: 5.0000 mg | ORAL_TABLET | ORAL | Status: DC | PRN

## 2018-05-11 MED ORDER — ACETAMINOPHEN 160 MG/5ML PO SOLN: 1000.0000 mg | Freq: Four times a day (QID) | ORAL | Status: DC

## 2018-05-11 MED ORDER — ASPIRIN EC 81 MG PO TBEC
81.0000 mg | DELAYED_RELEASE_TABLET | Freq: Every day | ORAL | Status: DC
  Administered 2018-05-12: 81 mg via ORAL
  Filled 2018-05-11: qty 1

## 2018-05-11 MED ORDER — MIDAZOLAM HCL 2 MG/2ML IJ SOLN
INTRAMUSCULAR | Status: DC | PRN
  Administered 2018-05-11 (×2): 1 mg via INTRAVENOUS

## 2018-05-11 MED ORDER — ONDANSETRON HCL 4 MG/2ML IJ SOLN: 4.0000 mg | Freq: Four times a day (QID) | INTRAMUSCULAR | Status: DC | PRN

## 2018-05-11 MED ORDER — FENTANYL CITRATE (PF) 250 MCG/5ML IJ SOLN
INTRAMUSCULAR | Status: AC
  Filled 2018-05-11: qty 5

## 2018-05-11 MED ORDER — SODIUM CHLORIDE 0.9 % IV SOLN
INTRAVENOUS | Status: AC
  Filled 2018-05-11 (×3): qty 1.2

## 2018-05-11 MED ORDER — LIDOCAINE HCL (PF) 1 % IJ SOLN
INTRAMUSCULAR | Status: AC
  Filled 2018-05-11: qty 30

## 2018-05-11 MED ORDER — LIDOCAINE HCL 1 % IJ SOLN
INTRAMUSCULAR | Status: DC | PRN
  Administered 2018-05-11: 6 mL

## 2018-05-11 MED ORDER — NITROGLYCERIN IN D5W 200-5 MCG/ML-% IV SOLN: 0.0000 ug/min | INTRAVENOUS | Status: DC

## 2018-05-11 MED ORDER — ASPIRIN 81 MG PO CHEW: 81.0000 mg | CHEWABLE_TABLET | Freq: Every day | ORAL | Status: DC

## 2018-05-11 MED ORDER — MIDAZOLAM HCL 2 MG/2ML IJ SOLN: 2.0000 mg | INTRAMUSCULAR | Status: DC | PRN

## 2018-05-11 MED ORDER — POLYVINYL ALCOHOL 1.4 % OP SOLN
1.0000 [drp] | Freq: Two times a day (BID) | OPHTHALMIC | Status: DC | PRN
  Filled 2018-05-11: qty 15

## 2018-05-11 MED ORDER — ALBUMIN HUMAN 5 % IV SOLN: 250.0000 mL | INTRAVENOUS | Status: AC | PRN

## 2018-05-11 MED ORDER — HEPARIN SODIUM (PORCINE) 1000 UNIT/ML IJ SOLN
INTRAMUSCULAR | Status: AC
  Filled 2018-05-11: qty 1

## 2018-05-11 MED ORDER — ONDANSETRON HCL 4 MG/2ML IJ SOLN
INTRAMUSCULAR | Status: DC | PRN
  Administered 2018-05-11: 4 mg via INTRAVENOUS

## 2018-05-11 MED ORDER — SODIUM CHLORIDE 0.9 % IJ SOLN
INTRAMUSCULAR | Status: AC
  Filled 2018-05-11: qty 10

## 2018-05-11 MED ORDER — ACETAMINOPHEN 500 MG PO TABS
1000.0000 mg | ORAL_TABLET | Freq: Four times a day (QID) | ORAL | Status: DC
  Administered 2018-05-12: 1000 mg via ORAL
  Filled 2018-05-11: qty 2

## 2018-05-11 MED ORDER — SODIUM CHLORIDE 0.9 % IV SOLN: INTRAVENOUS | Status: DC

## 2018-05-11 SURGICAL SUPPLY — 60 items
BAG DECANTER FOR FLEXI CONT (MISCELLANEOUS) ×4 IMPLANT
BAG SNAP BAND KOVER 36X36 (MISCELLANEOUS) ×8 IMPLANT
BLADE CLIPPER SURG (BLADE) IMPLANT
CABLE ADAPT CONN TEMP 6FT (ADAPTER) ×4 IMPLANT
CANISTER SUCT 3000ML PPV (MISCELLANEOUS) ×4 IMPLANT
CATH DIAG EXPO 6F VENT PIG 145 (CATHETERS) ×8 IMPLANT
CATH EXPO 5FR AL1 (CATHETERS) ×4 IMPLANT
CATH INFINITI 6F AL2 (CATHETERS) IMPLANT
CATH S G BIP PACING (SET/KITS/TRAYS/PACK) ×4 IMPLANT
CONT SPEC 4OZ CLIKSEAL STRL BL (MISCELLANEOUS) ×12 IMPLANT
COVER BACK TABLE 80X110 HD (DRAPES) ×4 IMPLANT
COVER DOME SNAP 22 D (MISCELLANEOUS) IMPLANT
CRADLE DONUT ADULT HEAD (MISCELLANEOUS) ×4 IMPLANT
DERMABOND ADHESIVE PROPEN (GAUZE/BANDAGES/DRESSINGS) ×2
DERMABOND ADVANCED (GAUZE/BANDAGES/DRESSINGS) ×2
DERMABOND ADVANCED .7 DNX12 (GAUZE/BANDAGES/DRESSINGS) ×2 IMPLANT
DERMABOND ADVANCED .7 DNX6 (GAUZE/BANDAGES/DRESSINGS) ×2 IMPLANT
DEVICE CLOSURE PERCLS PRGLD 6F (VASCULAR PRODUCTS) ×4 IMPLANT
DRAPE INCISE IOBAN 66X45 STRL (DRAPES) ×4 IMPLANT
DRSG TEGADERM 4X4.75 (GAUZE/BANDAGES/DRESSINGS) ×8 IMPLANT
ELECT REM PT RETURN 9FT ADLT (ELECTROSURGICAL) ×8
ELECTRODE REM PT RTRN 9FT ADLT (ELECTROSURGICAL) ×4 IMPLANT
GAUZE SPONGE 4X4 12PLY STRL (GAUZE/BANDAGES/DRESSINGS) ×4 IMPLANT
GLOVE BIO SURGEON STRL SZ7.5 (GLOVE) ×4 IMPLANT
GLOVE BIO SURGEON STRL SZ8 (GLOVE) ×4 IMPLANT
GLOVE EUDERMIC 7 POWDERFREE (GLOVE) IMPLANT
GLOVE ORTHO TXT STRL SZ7.5 (GLOVE) IMPLANT
GOWN STRL REUS W/ TWL LRG LVL3 (GOWN DISPOSABLE) ×8 IMPLANT
GOWN STRL REUS W/ TWL XL LVL3 (GOWN DISPOSABLE) ×2 IMPLANT
GOWN STRL REUS W/TWL LRG LVL3 (GOWN DISPOSABLE) ×8
GOWN STRL REUS W/TWL XL LVL3 (GOWN DISPOSABLE) ×2
GUIDEWIRE SAF TJ AMPL .035X180 (WIRE) ×4 IMPLANT
GUIDEWIRE SAFE TJ AMPLATZ EXST (WIRE) ×4 IMPLANT
GUIDEWIRE STRAIGHT .035 260CM (WIRE) ×4 IMPLANT
KIT BASIN OR (CUSTOM PROCEDURE TRAY) ×4 IMPLANT
KIT HEART LEFT (KITS) ×4 IMPLANT
KIT TURNOVER KIT B (KITS) ×4 IMPLANT
NEEDLE PERC 18GX7CM (NEEDLE) ×4 IMPLANT
NS IRRIG 1000ML POUR BTL (IV SOLUTION) ×4 IMPLANT
PACK ENDOVASCULAR (PACKS) ×4 IMPLANT
PAD ARMBOARD 7.5X6 YLW CONV (MISCELLANEOUS) ×8 IMPLANT
PAD ELECT DEFIB RADIOL ZOLL (MISCELLANEOUS) ×4 IMPLANT
PENCIL BUTTON HOLSTER BLD 10FT (ELECTRODE) ×4 IMPLANT
PERCLOSE PROGLIDE 6F (VASCULAR PRODUCTS) ×8
SET MICROPUNCTURE 5F STIFF (MISCELLANEOUS) ×4 IMPLANT
SHEATH BRITE TIP 6FR 35CM (SHEATH) ×4 IMPLANT
SHEATH PINNACLE 8F 10CM (SHEATH) ×4 IMPLANT
SLEEVE REPOSITIONING LENGTH 30 (MISCELLANEOUS) ×4 IMPLANT
STOPCOCK MORSE 400PSI 3WAY (MISCELLANEOUS) ×8 IMPLANT
SUT SILK  1 MH (SUTURE) ×2
SUT SILK 1 MH (SUTURE) ×2 IMPLANT
SYR 50ML LL SCALE MARK (SYRINGE) ×4 IMPLANT
SYR CONTROL 10ML LL (SYRINGE) ×4 IMPLANT
TAPE CLOTH SURG 6X10 WHT LF (GAUZE/BANDAGES/DRESSINGS) ×4 IMPLANT
TOWEL GREEN STERILE (TOWEL DISPOSABLE) ×8 IMPLANT
TRANSDUCER W/STOPCOCK (MISCELLANEOUS) ×8 IMPLANT
VALVE HEART TRANSCATH SZ3 23MM (Prosthesis & Implant Heart) ×4 IMPLANT
WIRE .035 3MM-J 145CM (WIRE) ×4 IMPLANT
WIRE AMPLATZ SS-J .035X260CM (WIRE) ×4 IMPLANT
WIRE BENTSON .035X145CM (WIRE) ×4 IMPLANT

## 2018-05-11 NOTE — Anesthesia Procedure Notes (Signed)
Arterial Line Insertion Start/End5/28/2019 7:00 AM, 05/11/2018 7:05 AM Performed by: De Nurse, CRNA, CRNA  Patient location: Pre-op. Lidocaine 1% used for infiltration and patient sedated Right, radial was placed Catheter size: 20 G Hand hygiene performed  and maximum sterile barriers used  Allen's test indicative of satisfactory collateral circulation Attempts: 2 Procedure performed without using ultrasound guided technique. Following insertion, dressing applied and Biopatch. Post procedure assessment: normal  Patient tolerated the procedure well with no immediate complications.

## 2018-05-11 NOTE — Anesthesia Procedure Notes (Signed)
Procedure Name: MAC Date/Time: 05/11/2018 7:35 AM Performed by: Barrington Ellison, CRNA Pre-anesthesia Checklist: Patient identified, Emergency Drugs available, Suction available, Patient being monitored and Timeout performed Patient Re-evaluated:Patient Re-evaluated prior to induction Oxygen Delivery Method: Simple face mask

## 2018-05-11 NOTE — Interval H&P Note (Signed)
History and Physical Interval Note:  05/11/2018 6:20 AM  Sydney Ingram  has presented today for surgery, with the diagnosis of Severe Aortic Stenosis  The various methods of treatment have been discussed with the patient and family. After consideration of risks, benefits and other options for treatment, the patient has consented to  Procedure(s): TRANSCATHETER AORTIC VALVE REPLACEMENT, TRANSFEMORAL (N/A) TRANSESOPHAGEAL ECHOCARDIOGRAM (TEE) (N/A) as a surgical intervention .  The patient's history has been reviewed, patient examined, no change in status, stable for surgery.  I have reviewed the patient's chart and labs.  Questions were answered to the patient's satisfaction.     Purcell Nails

## 2018-05-11 NOTE — Progress Notes (Signed)
CT Surgery  Resting quietly HR 55, good BP No groin hematoma

## 2018-05-11 NOTE — Op Note (Signed)
[facility]   MULTIDISCIPLINARY HEART VALVE TEAM   TAVR OPERATIVE NOTE   Date of Procedure:  05/11/2018  Preoperative Diagnosis: Severe Aortic Stenosis   Postoperative Diagnosis: Same   Procedure:    Transcatheter Aortic Valve Replacement - Percutaneous Right Transfemoral Approach  Edwards Sapien 3 THV (size 23 mm, model # 9600TFX, serial # [id number])   Co-Surgeons:  Tonny Bollman, MD and Salvatore Decent. Cornelius Moras, MD  Anesthesiologist:  Gaynelle Adu, MD  Echocardiographer:  Charlton Haws, MD  Pre-operative Echo Findings:  Severe aortic stenosis  Moderate left ventricular systolic dysfunction  Post-operative Echo Findings:  No paravalvular leak  Unchanged left ventricular systolic function   BRIEF CLINICAL NOTE AND INDICATIONS FOR SURGERY  The patient is a 72 year old woman with a history of hypertension, hyperlipidemia, type 2 DM, stage lll-lV chronic kidney disease, coronary artery disease s/p PCI and stenting about 12 years ago in [place] followed by restenosis and CABG at [facility] in 2009, aortic stenosis and chronic diastolic heart failure. She moved to [place] about 5 years ago and has been followed by Dr. Hanley Hays. Serial echocardiograms have shown progressive aortic stenosis and her most recent done by Dr. Hanley Hays on 03/04/2018 showed a mean transvalvular gradient of 45 mm Hg, AVA of 0.6 cm2 and an EF of 55-60%. She had a repeat echo here on 03/24/2018 which showed a possible bicuspid aortic valve that was severely calcified and thickened with restricted mobility.  The mean aortic valve gradient was 47 mmHg.  The dimensionless index was 0.22.  Left ventricular ejection fraction was reduced to 30 to 35% with diffuse hypokinesis with akinesis of the basal and mid inferior and inferolateral walls.  There is grade 2 diastolic dysfunction.  Cardiac catheterization on 03/24/2018 showed severe native three-vessel coronary disease with total occlusion of the right  coronary artery, severe left main stenosis, severe ramus stenosis, and severe proximal LAD stenosis.  There is continued patency of the left internal mammary graft to the LAD with total occlusion of the saphenous vein grafts.  The mean aortic valve gradient was 20 mmHg with a peak gradient of 50 mmHg.  The LVEDP was 18 mm Hg.  During the course of the patient's preoperative work up they have been evaluated comprehensively by a multidisciplinary team of specialists coordinated through the Multidisciplinary Heart Valve Clinic in the [facility].  They have been demonstrated to suffer from symptomatic severe aortic stenosis as noted above. The patient has been counseled extensively as to the relative risks and benefits of all options for the treatment of severe aortic stenosis including long term medical therapy, conventional surgery for aortic valve replacement, and transcatheter aortic valve replacement.  All questions have been answered, and the patient provides full informed consent for the operation as described.   DETAILS OF THE OPERATIVE PROCEDURE  PREPARATION:    The patient is brought to the operating room on the above mentioned date and central monitoring was established by the anesthesia team including placement of a central venous line and radial arterial line. The patient is placed in the supine position on the operating table.  Intravenous antibiotics are administered. The patient is monitored closely throughout the procedure under conscious sedation.    Baseline transthoracic echocardiogram was performed. The patient's chest, abdomen, both groins, and both lower extremities are prepared and draped in a sterile manner. A time out procedure is performed.   PERIPHERAL ACCESS:    Using the modified Seldinger  technique, femoral arterial and venous access was obtained with placement of 6 Fr sheaths on the left side.  A pigtail diagnostic catheter was passed through the  left arterial sheath under fluoroscopic guidance into the aortic root.  A temporary transvenous pacemaker catheter was passed through the left femoral venous sheath under fluoroscopic guidance into the right ventricle.  The pacemaker was tested to ensure stable lead placement and pacemaker capture. Aortic root angiography was performed in order to determine the optimal angiographic angle for valve deployment.   TRANSFEMORAL ACCESS:   Percutaneous transfemoral access and sheath placement was performed by Dr. Excell Seltzer using ultrasound guidance.  The right common femoral artery was cannulated using a micropuncture needle and appropriate location was verified using hand injection angiogram.  A pair of Abbott Perclose percutaneous closure devices were placed and a 6 French sheath replaced into the femoral artery.  The patient was heparinized systemically and ACT verified > 250 seconds.    A 14 Fr transfemoral E-sheath was introduced into the right common femoral artery after progressively dilating over an Amplatz superstiff wire. An AL-1 catheter was used to direct a straight-tip exchange length wire across the native aortic valve into the left ventricle. This was exchanged out for a pigtail catheter and position was confirmed in the LV apex. Simultaneous LV and Ao pressures were recorded.  The pigtail catheter was exchanged for an Amplatz Extra-stiff wire in the LV apex.  Echocardiography was utilized to confirm appropriate wire position and no sign of entanglement in the mitral subvalvular apparatus.   TRANSCATHETER HEART VALVE DEPLOYMENT:   An Edwards Sapien 3 transcatheter heart valve (size 23 mm, model #9600TFX, serial #[id number]) was prepared and crimped per manufacturer's guidelines, and the proper orientation of the valve is confirmed on the [employer] delivery system. The valve was advanced through the introducer sheath using normal technique until in an appropriate position in the abdominal aorta  beyond the sheath tip. The balloon was then retracted and using the fine-tuning wheel was centered on the valve. The valve was then advanced across the aortic arch using appropriate flexion of the catheter. The valve was carefully positioned across the aortic valve annulus. The Commander catheter was retracted using normal technique. Once final position of the valve has been confirmed by angiographic assessment, the valve is deployed while temporarily holding ventilation and during rapid ventricular pacing to maintain systolic blood pressure < 50 mmHg and pulse pressure < 10 mmHg. The balloon inflation is held for >3 seconds after reaching full deployment volume. Once the balloon has fully deflated the balloon is retracted into the ascending aorta and valve function is assessed using echocardiography. There is felt to be no paravalvular leak and no central aortic insufficiency.  The patient's hemodynamic recovery following valve deployment is good.  The deployment balloon and guidewire are both removed.    PROCEDURE COMPLETION:   The sheath was removed and femoral artery closure performed by Dr Excell Seltzer.  Protamine was administered once femoral arterial repair was complete. The temporary pacemaker, pigtail catheters and femoral sheaths were removed with manual pressure used for hemostasis.   The patient tolerated the procedure well and is transported to the surgical intensive care in stable condition. There were no immediate intraoperative complications. All sponge instrument and needle counts are verified correct at completion of the operation.   No blood products were administered during the operation.  The patient received a total of 28 mL of intravenous contrast during the procedure.   Purcell Nails,  MD 05/11/2018 9:20 AM

## 2018-05-11 NOTE — Op Note (Signed)
[facility]   MULTIDISCIPLINARY HEART VALVE TEAM   TAVR OPERATIVE NOTE   Date of Procedure:  05/11/2018  Preoperative Diagnosis: Severe Aortic Stenosis   Postoperative Diagnosis: Same   Procedure:    Transcatheter Aortic Valve Replacement - Percutaneous  Transfemoral Approach  Edwards Sapien 3 THV (size 23 mm, model # 9600TFX, serial # [id number])   Co-Surgeons:  Salvatore Decent. Cornelius Moras, MD and Tonny Bollman, MD  Anesthesiologist:  Dr Gaynelle Adu  Echocardiographer:  Dr Charlton Haws  Pre-operative Echo Findings:  Severe aortic stenosis  Moderate left ventricular systolic dysfunction  Post-operative Echo Findings:  No paravalvular leak  Unchanged left ventricular systolic function  BRIEF CLINICAL NOTE AND INDICATIONS FOR SURGERY  Please see the complete operative note of Dr Cornelius Moras for background history and indications.   During the course of the patient's preoperative work up they have been evaluated comprehensively by a multidisciplinary team of specialists coordinated through the Multidisciplinary Heart Valve Clinic in the [facility].  They have been demonstrated to suffer from symptomatic severe aortic stenosis as noted above. The patient has been counseled extensively as to the relative risks and benefits of all options for the treatment of severe aortic stenosis including long term medical therapy, conventional surgery for aortic valve replacement, and transcatheter aortic valve replacement.  The patient has been independently evaluated by two cardiac surgeons including Dr. Cornelius Moras and Dr. Laneta Simmers, and they are felt to be at moderate risk for conventional surgical aortic valve replacement. Both surgeons indicated the patient would be a poor candidate for conventional surgery because of comorbidities including LV dysfunction, CKD 3, CHF, prior CABG, poor functional capacity .   Based upon review of all of the patient's preoperative  diagnostic tests they are felt to be candidate for transcatheter aortic valve replacement using the transfemoral approach as an alternative to high risk conventional surgery.    Following the decision to proceed with transcatheter aortic valve replacement, a discussion has been held regarding what types of management strategies would be attempted intraoperatively in the event of life-threatening complications, including whether or not the patient would be considered a candidate for the use of cardiopulmonary bypass and/or conversion to open sternotomy for attempted surgical intervention.  The patient has been advised of a variety of complications that might develop peculiar to this approach including but not limited to risks of death, stroke, paravalvular leak, aortic dissection or other major vascular complications, aortic annulus rupture, device embolization, cardiac rupture or perforation, acute myocardial infarction, arrhythmia, heart block or bradycardia requiring permanent pacemaker placement, congestive heart failure, respiratory failure, renal failure, pneumonia, infection, other late complications related to structural valve deterioration or migration, or other complications that might ultimately cause a temporary or permanent loss of functional independence or other long term morbidity.  The patient provides full informed consent for the procedure as described and all questions were answered preoperatively.  DETAILS OF THE OPERATIVE PROCEDURE  PREPARATION:   The patient is brought to the operating room on the above mentioned date and central monitoring was established by the anesthesia team including placement of a central venous catheter and radial arterial line. The patient is placed in the supine position on the operating table.  Intravenous antibiotics are administered. The patient is monitored closely throughout the procedure under conscious sedation.  Baseline transthoracic echocardiogram is  performed. The patient's chest, abdomen, both groins, and both lower extremities are prepared and draped in a sterile manner. A time out procedure is  performed.   PERIPHERAL ACCESS:   Using ultrasound guidance, femoral arterial and venous access is obtained with placement of 6 Fr sheaths on the left side.  Ultrasound images are captured and stored in the patient's paper chart. A pigtail diagnostic catheter was passed through the femoral arterial sheath under fluoroscopic guidance into the aortic root.  A temporary transvenous pacemaker catheter was passed through the femoral venous sheath under fluoroscopic guidance into the right ventricle.  The pacemaker was tested to ensure stable lead placement and pacemaker capture. Aortic root angiography was performed in order to determine the optimal angiographic angle for valve deployment.  TRANSFEMORAL ACCESS:  A micropuncture technique is used to access the right femoral artery under fluoroscopic and ultrasound guidance.   Ultrasound images are captured and stored in the patient's paper chart. 2 Perclose devices are deployed at 10' and 2' positions to 'PreClose' the femoral artery. An 8 French sheath is placed and then an Amplatz Superstiff wire is advanced through the sheath. This is changed out for a 14 French transfemoral E-Sheath after progressively dilating over the Superstiff wire.  An AL-1 catheter was used to direct a straight-tip exchange length wire across the native aortic valve into the left ventricle. This was exchanged out for a pigtail catheter and position was confirmed in the LV apex. Simultaneous LV and Ao pressures were recorded.  The pigtail catheter was exchanged for an Amplatz Extra-stiff wire in the LV apex.  Echocardiography was utilized to confirm appropriate wire position and no sign of entanglement in the mitral subvalvular apparatus.  BALLOON AORTIC VALVULOPLASTY:  Not performed  TRANSCATHETER HEART VALVE DEPLOYMENT:  An Edwards  Sapien 3 transcatheter heart valve (size 23 mm) was prepared and crimped per manufacturer's guidelines, and the proper orientation of the valve is confirmed on the [employer] delivery system. The valve was advanced through the introducer sheath using normal technique until in an appropriate position in the abdominal aorta beyond the sheath tip. The balloon was then retracted and using the fine-tuning wheel was centered on the valve. The valve was then advanced across the aortic arch using appropriate flexion of the catheter. The valve was carefully positioned across the aortic valve annulus. The Commander catheter was retracted using normal technique. Once final position of the valve has been confirmed by angiographic assessment, the valve is deployed while temporarily holding ventilation and during rapid ventricular pacing to maintain systolic blood pressure < 50 mmHg and pulse pressure < 10 mmHg. The balloon inflation is held for >3 seconds after reaching full deployment volume. Once the balloon has fully deflated the balloon is retracted into the ascending aorta and valve function is assessed using echocardiography. There is felt to be no paravalvular leak and no central aortic insufficiency.  The patient's hemodynamic recovery following valve deployment is good.  The deployment balloon and guidewire are both removed. Echo demostrated acceptable post-procedural gradients, stable mitral valve function, and no aortic insufficiency.   PROCEDURE COMPLETION:  The sheath was removed and femoral artery closure is performed using the 2 previously deployed Perclose devices.  Protamine is administered once femoral arterial repair was complete. The site is clear with no evidence of bleeding or hematoma after the sutures are tightened. The temporary pacemaker, pigtail catheters and femoral sheaths were removed with manual pressure used for hemostasis.   The patient tolerated the procedure well and is transported  to the surgical intensive care in stable condition. There were no immediate intraoperative complications. All sponge instrument and needle counts are  verified correct at completion of the operation.   The patient received a total of 28 mL of intravenous contrast during the procedure.   Tonny Bollman, MD 05/11/2018 9:40 AM

## 2018-05-11 NOTE — Transfer of Care (Signed)
Immediate Anesthesia Transfer of Care Note  Patient: Sydney Ingram  Procedure(s) Performed: TRANSCATHETER AORTIC VALVE REPLACEMENT, TRANSFEMORAL (N/A Chest) INTRAOPERATIVE TRANSTHORACIC ECHOCARDIOGRAM (N/A )  Patient Location: ICU  Anesthesia Type:MAC  Level of Consciousness: drowsy, patient cooperative and responds to stimulation  Airway & Oxygen Therapy: Patient Spontanous Breathing and Patient connected to nasal cannula oxygen  Post-op Assessment: Report given to RN  Post vital signs: Reviewed and stable  Last Vitals:  Vitals Value Taken Time  BP 113/45 05/11/2018  9:31 AM  Temp    Pulse 53 05/11/2018  9:35 AM  Resp 21 05/11/2018  9:35 AM  SpO2 94 % 05/11/2018  9:35 AM  Vitals shown include unvalidated device data.  Last Pain:  Vitals:   05/11/18 0609  PainSc: 0-No pain         Complications: No apparent anesthesia complications

## 2018-05-11 NOTE — Progress Notes (Signed)
  [facility]   MULTIDISCIPLINARY HEART VALVE TEAM  Patient doing well s/p TAVR. She is hemodynamically stable. Groin sites stable. HR is stable in sinus bradycardia with ectopy. ECG with no high grade block. Plan to DC arterial line. Early ambulation and plan to transfer to tele tomorrow.   Cline Crock PA-C  [facility]  Pager [phone],

## 2018-05-11 NOTE — Anesthesia Procedure Notes (Signed)
Central Venous Catheter Insertion Performed by: Gaynelle Adu, MD, anesthesiologist Start/End5/28/2019 6:35 AM, 05/11/2018 6:50 AM Patient location: Pre-op. Preanesthetic checklist: patient identified, IV checked, site marked, risks and benefits discussed, surgical consent, monitors and equipment checked, pre-op evaluation, timeout performed and anesthesia consent Position: Trendelenburg Lidocaine 1% used for infiltration and patient sedated Hand hygiene performed , maximum sterile barriers used  and Seldinger technique used Catheter size: 8 Fr Total catheter length 16. Central line was placed.Double lumen Procedure performed using ultrasound guided technique. Ultrasound Notes:anatomy identified, needle tip was noted to be adjacent to the nerve/plexus identified, no ultrasound evidence of intravascular and/or intraneural injection and image(s) printed for medical record Attempts: 1 Following insertion, dressing applied, line sutured and Biopatch. Post procedure assessment: blood return through all ports  Patient tolerated the procedure well with no immediate complications.

## 2018-05-11 NOTE — Progress Notes (Signed)
  Echocardiogram 2D Echocardiogram Limited has been performed.  [name] M 05/11/2018, 9:05 AM

## 2018-05-11 NOTE — Anesthesia Postprocedure Evaluation (Signed)
Anesthesia Post Note  Patient: DONZELLA CARROL  Procedure(s) Performed: TRANSCATHETER AORTIC VALVE REPLACEMENT, TRANSFEMORAL (N/A Chest) INTRAOPERATIVE TRANSTHORACIC ECHOCARDIOGRAM (N/A )     Patient location during evaluation: PACU Anesthesia Type: MAC Level of consciousness: awake and alert Pain management: pain level controlled Vital Signs Assessment: post-procedure vital signs reviewed and stable Respiratory status: spontaneous breathing, nonlabored ventilation and respiratory function stable Cardiovascular status: stable and blood pressure returned to baseline Postop Assessment: no apparent nausea or vomiting Anesthetic complications: no    Last Vitals:  Vitals:   05/11/18 1000 05/11/18 1100  BP: (!) 135/43 126/64  Pulse: (!) 54 (!) 46  Resp: 18 17  Temp:    SpO2: 97% 95%    Last Pain:  Vitals:   05/11/18 0609  PainSc: 0-No pain                 Jaycion Treml,W. EDMOND

## 2018-05-12 ENCOUNTER — Other Ambulatory Visit: Payer: Self-pay | Admitting: Physician Assistant

## 2018-05-12 ENCOUNTER — Inpatient Hospital Stay (HOSPITAL_COMMUNITY): Payer: Medicare Other

## 2018-05-12 ENCOUNTER — Encounter (HOSPITAL_COMMUNITY): Payer: Self-pay | Admitting: Cardiovascular Disease

## 2018-05-12 ENCOUNTER — Other Ambulatory Visit: Payer: Self-pay

## 2018-05-12 DIAGNOSIS — Z952 Presence of prosthetic heart valve: Secondary | ICD-10-CM

## 2018-05-12 DIAGNOSIS — I350 Nonrheumatic aortic (valve) stenosis: Secondary | ICD-10-CM

## 2018-05-12 DIAGNOSIS — I5043 Acute on chronic combined systolic (congestive) and diastolic (congestive) heart failure: Secondary | ICD-10-CM

## 2018-05-12 LAB — CBC
HEMATOCRIT: 33.2 % — AB (ref 36.0–46.0)
Hemoglobin: 10.5 g/dL — ABNORMAL LOW (ref 12.0–15.0)
MCH: 28 pg (ref 26.0–34.0)
MCHC: 31.6 g/dL (ref 30.0–36.0)
MCV: 88.5 fL (ref 78.0–100.0)
Platelets: 133 10*3/uL — ABNORMAL LOW (ref 150–400)
RBC: 3.75 MIL/uL — ABNORMAL LOW (ref 3.87–5.11)
RDW: 14.5 % (ref 11.5–15.5)
WBC: 8.4 10*3/uL (ref 4.0–10.5)

## 2018-05-12 LAB — BASIC METABOLIC PANEL
Anion gap: 9 (ref 5–15)
BUN: 25 mg/dL — ABNORMAL HIGH (ref 6–20)
CO2: 24 mmol/L (ref 22–32)
CREATININE: 1.63 mg/dL — AB (ref 0.44–1.00)
Calcium: 8.8 mg/dL — ABNORMAL LOW (ref 8.9–10.3)
Chloride: 109 mmol/L (ref 101–111)
GFR calc Af Amer: 36 mL/min — ABNORMAL LOW (ref >60)
GFR calc non Af Amer: 31 mL/min — ABNORMAL LOW (ref >60)
GLUCOSE: 90 mg/dL (ref 65–99)
Potassium: 4.1 mmol/L (ref 3.5–5.1)
Sodium: 142 mmol/L (ref 135–145)

## 2018-05-12 LAB — GLUCOSE, CAPILLARY
GLUCOSE-CAPILLARY: 113 mg/dL — AB (ref 65–99)
GLUCOSE-CAPILLARY: 60 mg/dL — AB (ref 65–99)
Glucose-Capillary: 86 mg/dL (ref 65–99)

## 2018-05-12 LAB — ECHOCARDIOGRAM COMPLETE: Weight: 2684.32 oz

## 2018-05-12 LAB — MAGNESIUM: Magnesium: 1.9 mg/dL (ref 1.7–2.4)

## 2018-05-12 MED ORDER — AMLODIPINE BESYLATE 10 MG PO TABS
10.0000 mg | ORAL_TABLET | Freq: Every day | ORAL | Status: DC
  Administered 2018-05-12: 10 mg via ORAL
  Filled 2018-05-12: qty 1

## 2018-05-12 MED ORDER — CARVEDILOL 25 MG PO TABS
25.0000 mg | ORAL_TABLET | Freq: Two times a day (BID) | ORAL | Status: DC
  Administered 2018-05-12 (×2): 25 mg via ORAL
  Filled 2018-05-12 (×2): qty 1

## 2018-05-12 MED ORDER — LOSARTAN POTASSIUM 50 MG PO TABS
50.0000 mg | ORAL_TABLET | Freq: Every day | ORAL | Status: DC
  Administered 2018-05-12: 50 mg via ORAL
  Filled 2018-05-12: qty 1

## 2018-05-12 MED ORDER — PANTOPRAZOLE SODIUM 40 MG PO TBEC: 40.0000 mg | DELAYED_RELEASE_TABLET | Freq: Every day | ORAL | 6 refills | Status: DC

## 2018-05-12 MED ORDER — CLOPIDOGREL BISULFATE 75 MG PO TABS: 75.0000 mg | ORAL_TABLET | Freq: Every day | ORAL | 5 refills | Status: DC

## 2018-05-12 MED ORDER — FUROSEMIDE 20 MG PO TABS
20.0000 mg | ORAL_TABLET | ORAL | Status: DC
  Administered 2018-05-12: 20 mg via ORAL
  Filled 2018-05-12: qty 1

## 2018-05-12 NOTE — Progress Notes (Signed)
CARDIAC REHAB PHASE I   PRE:  Rate/Rhythm: 75 SR  BP:  Supine: 128/53  Sitting:   Standing:    SaO2: 89%RA   MODE:  Ambulation: 370 ft   POST:  Rate/Rhythm: 76 SR  BP:  Supine:   Sitting: 149/70  Standing:    SaO2: 89-91%RA  94% in bed 1430-1504 Pt walked 370 ft on RA with hand held asst. Gait steady. Monitored sats whole walk. To bed after walk. Gave low sodium diets, ex ed with pt and daughter. Pt not interested in CRP 2. Left off oxygen.   Luetta Nutting, RN BSN  05/12/2018 3:00 PM

## 2018-05-12 NOTE — Discharge Instructions (Signed)
Your reflux medication, prilosec, was discontinued and a medication called protonix was prescribed to your pharmacy. This is because prilosec can interact with plavix making it less effective.    ACTIVITY AND EXERCISE  Daily activity and exercise are an important part of your recovery. People recover at different rates depending on their general health and type of valve procedure.  Most people recovering from TAVR feel better relatively quickly   No lifting, pushing, pulling more than 10 pounds (examples to avoid: groceries, vacuuming, gardening, golfing):             - For one week with a procedure through the groin.             - For six weeks for procedures through the chest wall.             - For three months for procedures through the breast-bone. NOTE: You will typically see one of our providers 7-10 days after your procedure to discuss WHEN TO RESUME the above activities.    DRIVING  Do not drive for until you are seen for follow up and cleared by a provider.  If you have been told by your doctor in the past that you may not drive, you must talk with him/her before you begin driving again.   DRESSING  Groin site: you may leave the clear dressing over the site for up to one week or until it falls off.   HYGIENE  If you had a femoral (leg) procedure, you may take a shower when you return home. After the shower, pat the site dry. Do NOT use powder, oils or lotions in your groin area until the site has completely healed.  If you had a chest procedure, you may shower when you return home unless specifically instructed not to by your discharging practitioner.             - DO NOT scrub incision; pat dry with a towel             - DO NOT apply any lotions, oils, powders to the incision             - No tub baths / swimming for at least 2 weeks.  If you notice any fevers, chills, increased pain, swelling, bleeding or pus, please contact your doctor.  ADDITIONAL  INFORMATION  If you are going to have an upcoming dental procedure, please contact our office as you will require antibiotics ahead of time to prevent infection on your heart valve.

## 2018-05-12 NOTE — Discharge Summary (Addendum)
HEART AND VASCULAR CENTER   MULTIDISCIPLINARY HEART VALVE TEAM   Discharge Summary    Patient ID: Sydney Ingram,  MRN: [id number], DOB/AGE: [birth year] 72 y.o.  Admit date: 05/11/2018 Discharge date: 05/12/2018  Primary Care Provider: Lollie Marrow T Primary Cardiologist: Dr. Hanley Hays / Dr. Excell Seltzer & Dr. Cornelius Moras (TAVR)   Discharge Diagnoses    Principal Problem:   S/P TAVR (transcatheter aortic valve replacement) Active Problems:   Aortic stenosis   Pulmonary hypertension (HCC)   Chronic kidney disease (CKD), stage III (moderate) (HCC)   Essential hypertension   Hyperlipidemia   Type II diabetes mellitus (HCC)   Acute on chronic combined systolic and diastolic CHF (congestive heart failure) (HCC)   Allergies Allergies  Allergen Reactions  . Penicillins Other (See Comments)    Yeast infection     History of Present Illness     Sydney Ingram is a 72 y.o. female with a history of chronic combined S/D CHF (EF 30-35%), HTN, CKD, T2DM, CAD s/p CABG (2009) and severe AS who presented to [facility] on 05/11/18 for planned TAVR.  She moved to [facility] about 5 years ago and has been followed by Dr. Hanley Hays. Serial echocardiograms have shown progressive aortic stenosis and her most recent done by Dr. Hanley Hays on 03/04/2018 showed a mean transvalvular gradient of 45 mm Hg, AVA of 0.6 cm2 and an EF of 55-60%. She had a repeat echo here on 03/24/2018 which showed a possible bicuspid aortic valve that was severely calcified and thickened with restricted mobility.  The mean aortic valve gradient was 47 mmHg.  The dimensionless index was 0.22.  Left ventricular ejection fraction was reduced to 30 to 35% with diffuse hypokinesis with akinesis of the basal and mid inferior and inferolateral walls.  There is grade 2 diastolic dysfunction.  Cardiac catheterization on 03/24/2018 showed severe native three-vessel coronary disease with total occlusion of the right coronary artery, severe left main stenosis,  severe ramus stenosis, and severe proximal LAD stenosis.  There is continued patency of the left internal mammary graft to the LAD with total occlusion of the saphenous vein grafts.  The mean aortic valve gradient was 20 mmHg with a peak gradient of 50 mmHg. The LVEDP was 18 mm Hg.  She was evaluated by the multidisciplinary valve team and set up for TAVR on 05/11/18.   Hospital Course    Severe AS: s/p successful TAVR with a 23 mm Edwards Sapien THV via the TF approach on 05/11/18. Post operative echo completed and pending formal read- Dr Excell Seltzer to review prior to discharge. Groin sites stable. Continue ASA and plavix. She will be discharged home today with TOC follow up in the valve clinic in one week.   HTN: BP elevated. We have resumed her home medications inlcuding amlodipine, lasix, coreg and losartan.  CKD: creat at her baseline around 1.6  Acute on chronic combined S/D CHF: pre admission labwork revealed an elevated BNP ~1600. Post op CXR showed mild CHF. This has been treated with TAVR. Her home lasix has been resumed. EF remains ~ 30-35%.  DMT2: treated with SSI while inpatient. Resume home meds at discharge.   CAD s/p CABG: continue medical therapy.   GERD: prilosec changed to protonix give possible interaction with plavix  The patient has had an uncomplicated hospital course and is recovering well. The femoral catheter sites are stable. She has been seen by Dr. Excell Seltzer today and deemed ready for discharge home. All follow-up appointments have been scheduled. Discharge  medications are listed below.  _____________  Discharge Vitals Blood pressure (!) 174/75, pulse 70, temperature 100 F (37.8 C), temperature source Oral, resp. rate 20, weight 167 lb 12.3 oz (76.1 kg), SpO2 96 %.  Filed Weights   05/12/18 0500  Weight: 167 lb 12.3 oz (76.1 kg)   VS:  BP (!) 174/75 (BP Location: Left Arm)   Pulse 70   Temp 100 F (37.8 C) (Oral)   Resp 20   Wt 167 lb 12.3 oz (76.1 kg)    SpO2 96%   BMI 29.72 kg/m    GEN: Well nourished, well developed, in no acute distress  HEENT: normal  Neck: no JVD, carotid bruits, or masses Cardiac: RRR; very soft flow murmur @ RUSB. No rubs, or gallops,no edema  Respiratory:  clear to auscultation bilaterally, normal work of breathing GI: soft, nontender, nondistended, + BS MS: no deformity or atrophy  Skin: warm and dry, no rash. Groin sites stable with no ecchymosis or hematoma  Neuro:  Alert and Oriented x 3, Strength and sensation are intact Psych: euthymic mood, full affect  Labs & Radiologic Studies     CBC Recent Labs    05/11/18 0953 05/12/18 0218  WBC  --  8.4  HGB 9.2* 10.5*  HCT 27.0* 33.2*  MCV  --  88.5  PLT  --  133*   Basic Metabolic Panel Recent Labs    16/10/96 0748 05/11/18 0953 05/12/18 0218  NA 144 144 142  K 3.9 4.0 4.1  CL 108  --  109  CO2  --   --  24  GLUCOSE 116* 152* 90  BUN 25*  --  25*  CREATININE 1.60*  --  1.63*  CALCIUM  --   --  8.8*  MG  --   --  1.9   Liver Function Tests No results for input(s): AST, ALT, ALKPHOS, BILITOT, PROT, ALBUMIN in the last 72 hours. No results for input(s): LIPASE, AMYLASE in the last 72 hours. Cardiac Enzymes No results for input(s): CKTOTAL, CKMB, CKMBINDEX, TROPONINI in the last 72 hours. BNP Invalid input(s): POCBNP D-Dimer No results for input(s): DDIMER in the last 72 hours. Hemoglobin A1C No results for input(s): HGBA1C in the last 72 hours. Fasting Lipid Panel No results for input(s): CHOL, HDL, LDLCALC, TRIG, CHOLHDL, LDLDIRECT in the last 72 hours. Thyroid Function Tests No results for input(s): TSH, T4TOTAL, T3FREE, THYROIDAB in the last 72 hours.  Invalid input(s): FREET3  Dg Chest 2 View  Result Date: 05/07/2018 CLINICAL DATA:  Preop for transcatheter aortic valve replacement. EXAM: CHEST - 2 VIEW COMPARISON:  None. FINDINGS: Mild cardiomegaly is noted. Sternotomy wires are noted. Atherosclerosis of thoracic aorta is noted.  Both lungs are clear. The visualized skeletal structures are unremarkable. IMPRESSION: No active cardiopulmonary disease. Aortic Atherosclerosis (ICD10-I70.0). Electronically Signed   By: Lupita Raider, M.D.   On: 05/07/2018 14:47   Dg Chest Port 1 View  Result Date: 05/11/2018 CLINICAL DATA:  Status post transcatheter aortic valve replacement. EXAM: PORTABLE CHEST 1 VIEW COMPARISON:  PA and lateral chest x-ray of May 08, 2018 FINDINGS: A prosthetic aortic valve cage is visible. The cardiac silhouette is enlarged. The pulmonary vascularity is engorged. The interstitial markings are increased greatest on the right. There is no pneumothorax nor large pleural effusion. The sternal wires are intact. There is calcification in the wall of the aortic arch. The right internal jugular venous catheter tip projects over the midportion of the SVC. IMPRESSION: Mild  CHF. The prosthetic valve cage is in reasonable position radiographically. Thoracic aortic atherosclerosis. Electronically Signed   By: David  Swazil[place] M.D.   On: 05/11/2018 10:32     Diagnostic Studies/Procedures    TAVR OPERATIVE NOTE   Date of Procedure:                05/11/2018  Preoperative Diagnosis:      Severe Aortic Stenosis  Procedure:        Transcatheter Aortic Valve Replacement - Percutaneous  Transfemoral Approach             Edwards Sapien 3 THV (size 23 mm, model # 9600TFX, serial # [id number])              Co-Surgeons:                        Salvatore Decent. Cornelius Moras, MD and Tonny Bollman, MD  Pre-operative Echo Findings: ? Severe aortic stenosis ? Moderate left ventricular systolic dysfunction  Post-operative Echo Findings: ? No paravalvular leak ? Unchanged left ventricular systolic function  _________________   2D ECHO 05/12/18: pending at the time of discharge     Disposition   Pt is being discharged home today in good condition.  Follow-up Plans & Appointments    Follow-up Information    Janetta Hora, PA-C. Go on 05/20/2018.   Specialties:  Cardiology, Radiology Why:  @ 2:30pm  Contact information: [address] [place] [address] [phone]            Discharge Medications     Medication List    STOP taking these medications   omeprazole 20 MG capsule Commonly known as:  PRILOSEC     TAKE these medications   amLODipine 10 MG tablet Commonly known as:  NORVASC Take 10 mg by mouth daily.   aspirin EC 81 MG tablet Take 81 mg by mouth daily.   atorvastatin 20 MG tablet Commonly known as:  LIPITOR Take 20 mg by mouth daily at 6 PM.   carvedilol 25 MG tablet Commonly known as:  COREG Take 25 mg by mouth 2 (two) times daily with a meal.   clopidogrel 75 MG tablet Commonly known as:  PLAVIX Take 1 tablet (75 mg total) by mouth daily.   Coenzyme Q10 200 MG capsule Take 200 mg by mouth daily.   furosemide 20 MG tablet Commonly known as:  LASIX Take 20 mg by mouth every other day.   glipiZIDE 10 MG 24 hr tablet Commonly known as:  GLUCOTROL XL Take 10 mg by mouth daily with breakfast.   HAIR/SKIN/NAILS PO Take 1 tablet by mouth daily.   linagliptin 5 MG Tabs tablet Commonly known as:  TRADJENTA Take 5 mg by mouth daily.   losartan 50 MG tablet Commonly known as:  COZAAR Take 50 mg by mouth daily.   multivitamin tablet Take 1 tablet by mouth daily.   pantoprazole 40 MG tablet Commonly known as:  PROTONIX Take 1 tablet (40 mg total) by mouth daily.   SYSTANE BALANCE 0.6 % Soln Generic drug:  Propylene Glycol Place 1 drop into both eyes 2 (two) times daily as needed (eye irritation).   Vitamin D3 2000 units Tabs Take 2,000 Units by mouth daily.         Outstanding Labs/Studies   none  Duration of Discharge Encounter   Greater than 30 minutes including physician time.  Signed, Cline Crock PA-C 05/12/2018, 9:02 AM  Patient seen, examined. Available data reviewed. Agree with findings, assessment, and  plan as outlined by Carlean Jews, PA-C.  The patient is independently interviewed and examined.  On my exam she is alert and oriented in no distress.  JVP is normal.  Lungs are clear.  Heart is regular rate and rhythm with a 2/6 ejection murmur at the right upper sternal border with no diastolic murmur.  Abdomen is soft and nontender.  Groin sites are stable bilaterally.  There is no peripheral edema.  The patient appears stable postoperative day #1 after TAVR.  Await completion of 2D echocardiogram.  As long as her echo shows no significant abnormalities, I think she is stable for discharge today.  She has already walked a lap around the ICU.  Her heart rhythm is stable and I have reviewed her telemetry.  Follow-up as outlined above.  Tonny Bollman, M.D. 05/12/2018 10:05 AM

## 2018-05-12 NOTE — Progress Notes (Signed)
Discharge paperwork and education provided. Patient and daughter state no pain and no further questions. Patient discharged to home with daughter in stable condition.  Alphonzo Dublin, RN 05/12/2018 [phone]

## 2018-05-13 ENCOUNTER — Telehealth: Payer: Self-pay | Admitting: Physician Assistant

## 2018-05-13 MED FILL — Magnesium Sulfate Inj 50%: INTRAMUSCULAR | Qty: 10 | Status: AC

## 2018-05-13 MED FILL — Phenylephrine HCl IV Soln 10 MG/ML: INTRAVENOUS | Qty: 2 | Status: AC

## 2018-05-13 MED FILL — Sodium Chloride IV Soln 0.9%: INTRAVENOUS | Qty: 250 | Status: AC

## 2018-05-13 MED FILL — Potassium Chloride Inj 2 mEq/ML: INTRAVENOUS | Qty: 40 | Status: AC

## 2018-05-13 MED FILL — Heparin Sodium (Porcine) Inj 1000 Unit/ML: INTRAMUSCULAR | Qty: 30 | Status: AC

## 2018-05-13 NOTE — Telephone Encounter (Signed)
  HEART AND VASCULAR CENTER   MULTIDISCIPLINARY HEART VALVE TEAM  Attempted TOC call. No answer. I left a message and will try again later.    Cline Crock PA-C  [facility]

## 2018-05-13 NOTE — Telephone Encounter (Signed)
  HEART AND VASCULAR CENTER   MULTIDISCIPLINARY HEART VALVE TEAM   Patient contacted regarding discharge from [facility] on 05/12/18  Patient understands to follow up with provider Carlean Jews on 6/6 at [facility].  Patient understands discharge instructions? yes Patient understands medications and regiment? yes Patient understands to bring all medications to this visit? yes  Cline Crock PA-C  [facility]

## 2018-05-19 NOTE — Progress Notes (Signed)
HEART AND VASCULAR CENTER   MULTIDISCIPLINARY HEART VALVE CLINIC                                       Cardiology Office Note    Date:  05/20/2018   ID:  Sydney Ingram, DOB September 23, 1946, MRN [id number]  PCP:  Sherrill Raring, MD  Cardiologist:   Dr. Hanley Hays / Dr. Excell Seltzer & Dr. Cornelius Moras (TAVR)  CC:  TOC visit s/p TAVR  History of Present Illness:  Sydney Ingram is a 72 y.o. 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   She moved to [facility] about 5 years ago and has been followed by Dr. Hanley Hays. Serial echocardiograms have shown progressive aortic stenosis and her most recentdone by Dr. Hanley Hays on 3/21/2019showed a mean transvalvular gradient of 45 mm Hg, AVA of 0.6 cm2 and an EF of 55-60%. She had a repeat echo here on 4/10/2019which showed a possible bicuspid aortic valve that was severely calcified and thickened with restricted mobility. The mean aortic valve gradient was 47 mmHg. The dimensionless index was 0.22. Left ventricular ejection fraction was reduced to 30 to 35% with diffuse hypokinesis with akinesis of the basal and mid inferior and inferolateral walls. There is grade 2 diastolic dysfunction. Cardiac catheterization on 03/24/2018 showed severe native three-vessel coronary disease with total occlusion of the right coronary artery, severe left main stenosis, severe ramus stenosis, and severe proximal LAD stenosis. There is continued patency of the left internal mammary graft to the LAD with total occlusion of the saphenous vein grafts. The mean aortic valve gradient was 20 mmHg with a peak gradient of 50 mmHg.The LVEDP was 18 mm Hg.  He underwent successful TAVR with a 23 mm Edwards Sapien THV via the TF approach on 05/11/18. Post operative echo showed EF 55%, normally functioning TAVR with trivial PVL with mean gradient 11 mm Hg.   Today she  presents to clinic for follow up. She is feeling great with no complaints. She can breath much better now. No CP or SOB. No LE edema, orthopnea or PND. No dizziness or syncope. No blood in stool or urine. No palpitations.    Past Medical History:  Diagnosis Date  . Aortic stenosis, severe   . CAD (coronary artery disease)   . Chronic combined systolic (congestive) and diastolic (congestive) heart failure (HCC)   . CKD (chronic kidney disease)   . Degenerative lumbar disc   . Diabetes type 2, controlled (HCC)   . Essential hypertension   . GERD (gastroesophageal reflux disease)   . Hyperlipidemia   . Hyperlipidemia   . Mitral regurgitation   . PAD (peripheral artery disease) (HCC)   . Pulmonary hypertension (HCC)   . 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 Thyroid nodule   . Type II diabetes mellitus (HCC)     Past Surgical History:  Procedure Laterality Date  . CORONARY ARTERY BYPASS GRAFT  2009   CABG x3 - [facility]  . INTRAOPERATIVE TRANSTHORACIC ECHOCARDIOGRAM N/A 05/11/2018   Procedure: INTRAOPERATIVE TRANSTHORACIC ECHOCARDIOGRAM;  Surgeon: Tonny Bollman, MD;  Location: [facility] OR;  Service: Open Heart Surgery;  Laterality: N/A;  .  LEFT HEART CATH AND CORS/GRAFTS ANGIOGRAPHY N/A 03/24/2018   Procedure: LEFT HEART CATH AND CORS/GRAFTS ANGIOGRAPHY;  Surgeon: Tonny Bollman, MD;  Location: [facility] INVASIVE CV LAB;  Service: Cardiovascular;  Laterality: N/A;  . PARTIAL HYSTERECTOMY    . PERCUTANEOUS CORONARY STENT INTERVENTION (PCI-S)  2008   details unclear  . TRANSCATHETER AORTIC VALVE REPLACEMENT, TRANSFEMORAL N/A 05/11/2018   Procedure: TRANSCATHETER AORTIC VALVE REPLACEMENT, TRANSFEMORAL;  Surgeon: Tonny Bollman, MD;  Location: [facility] OR;  Service: Open Heart Surgery;  Laterality: N/A;    Current Medications: Outpatient Medications Prior to  Visit  Medication Sig Dispense Refill  . amLODipine (NORVASC) 10 MG tablet Take 10 mg by mouth daily.    Marland Kitchen aspirin EC 81 MG tablet Take 81 mg by mouth daily.    Marland Kitchen atorvastatin (LIPITOR) 20 MG tablet Take 20 mg by mouth daily at 6 PM.     . Biotin w/ Vitamins C & E (HAIR/SKIN/NAILS PO) Take 1 tablet by mouth daily.    . carvedilol (COREG) 25 MG tablet Take 25 mg by mouth 2 (two) times daily with a meal.    . Cholecalciferol (VITAMIN D3) 2000 units TABS Take 2,000 Units by mouth daily.    . clopidogrel (PLAVIX) 75 MG tablet Take 1 tablet (75 mg total) by mouth daily. 30 tablet 5  . Coenzyme Q10 200 MG capsule Take 200 mg by mouth daily.     . furosemide (LASIX) 20 MG tablet Take 20 mg by mouth every other day.    Marland Kitchen glipiZIDE (GLUCOTROL XL) 10 MG 24 hr tablet Take 10 mg by mouth daily with breakfast.    . linagliptin (TRADJENTA) 5 MG TABS tablet Take 5 mg by mouth daily.    Marland Kitchen losartan (COZAAR) 50 MG tablet Take 50 mg by mouth daily.    . Multiple Vitamin (MULTIVITAMIN) tablet Take 1 tablet by mouth daily.    . pantoprazole (PROTONIX) 40 MG tablet Take 1 tablet (40 mg total) by mouth daily. 30 tablet 6  . Propylene Glycol (SYSTANE BALANCE) 0.6 % SOLN Place 1 drop into both eyes 2 (two) times daily as needed (eye irritation).     No facility-administered medications prior to visit.      Allergies:   Penicillins   Social History   Socioeconomic History  . Marital status: Legally Separated    Spouse name: Not on file  . Number of children: Not on file  . Years of education: Not on file  . Highest education level: Not on file  Occupational History  . Not on file  Social Needs  . Financial resource strain: Not on file  . Food insecurity:    Worry: Not on file    Inability: Not on file  . Transportation needs:    Medical: Not on file    Non-medical: Not on file  Tobacco Use  . Smoking status: Never Smoker  . Smokeless tobacco: Never Used  Substance and Sexual Activity  . Alcohol  use: Not Currently    Frequency: Never  . Drug use: Never  . Sexual activity: Not Currently  Lifestyle  . Physical activity:    Days per week: Not on file    Minutes per session: Not on file  . Stress: Not on file  Relationships  . Social connections:    Talks on phone: Not on file    Gets together: Not on file    Attends religious service: Not on file    Active member of club or organization:  Not on file    Attends meetings of clubs or organizations: Not on file    Relationship status: Not on file  Other Topics Concern  . Not on file  Social History Narrative  . Not on file     Family History:  The patient's family history includes Diabetes in her mother and sister; Heart disease in her brother; Hyperlipidemia in her sister.      ROS:   Please see the history of present illness.    ROS All other systems reviewed and are negative.   PHYSICAL EXAM:   VS:  BP (!) 146/66   Pulse 60   Ht 5\' 3"  (1.6 m)   Wt 173 lb (78.5 kg)   SpO2 97%   BMI 30.65 kg/m    GEN: Well nourished, well developed, in no acute distress  HEENT: normal  Neck: no JVD, carotid bruits, or masses Cardiac:RRR; 2/6 SEM @ RUSB. No rubs, or gallops. Trace bilateral pretibial edema  Respiratory:  clear to auscultation bilaterally, normal work of breathing GI: soft, nontender, nondistended, + BS MS: no deformity or atrophy  Skin: warm and dry, no rash. Groin sites healing well with no hematoma or ecchymosis Neuro:  Alert and Oriented x 3, Strength and sensation are intact Psych: euthymic mood, full affect  Wt Readings from Last 3 Encounters:  05/20/18 173 lb (78.5 kg)  05/12/18 167 lb 12.3 oz (76.1 kg)  05/07/18 167 lb 1.6 oz (75.8 kg)      Studies/Labs Reviewed:   EKG:  EKG is ordered today.  The ekg ordered today demonstrates normal sinus with sinus arrythmia, non specific ST abnormality  Recent Labs: 05/07/2018: ALT 14; B Natriuretic Peptide 1,702.7 05/12/2018: BUN 25; Creatinine, Ser 1.63;  Hemoglobin 10.5; Magnesium 1.9; Platelets 133; Potassium 4.1; Sodium 142   Lipid Panel No results found for: CHOL, TRIG, HDL, CHOLHDL, VLDL, LDLCALC, LDLDIRECT  Additional studies/ records that were reviewed today include:    TAVR OPERATIVE NOTE   Date of Procedure:05/11/2018  Preoperative Diagnosis:Severe Aortic Stenosis  Procedure:   Transcatheter Aortic Valve Replacement - Percutaneous Transfemoral Approach Edwards Sapien 3 THV (size 23mm, model # 9600TFX, serial # [id number])  Co-Surgeons:Clarence H. Cornelius Moras, MD and Tonny Bollman, MD  Pre-operative Echo Findings: ? Severe aortic stenosis ? Moderateleft ventricular systolic dysfunction  Post-operative Echo Findings: ? Noparavalvular leak ? Unchangedleft ventricular systolic function  _________________   Post operative echo 05/12/18 Study Conclusions - Left ventricle: The cavity size was normal. Wall thickness was   increased in a pattern of moderate LVH. Systolic function was   normal. The estimated ejection fraction was in the range of 55%   to 60%. Incoordinate septal motion - basal inferior hypokinesis.   The study is not technically sufficient to allow evaluation of LV   diastolic function. LV filling pressure is elevated. - Aortic valve: Edwards Sapien 3 TAVR (23 mmHg) - no obstruction   with trivial paravalvular leak. Mean gradient (S): 11 mm Hg. Peak   gradient (S): 22 mm Hg. Valve area (VTI): 1.58 cm^2. Valve area   (Vmax): 1.6 cm^2. Valve area (Vmean): 1.64 cm^2. - Mitral valve: Calcified annulus. Mildly thickened leaflets .   There was trivial regurgitation. - Left atrium: The atrium was mildly dilated. - Inferior vena cava: The vessel was normal in size. The   respirophasic diameter changes were in the normal range (>= 50%),   consistent with normal central venous pressure. - Pericardium, extracardiac: There was a left pleural  effusion. Impressions: -  Compared to a study on 05/11/2018, the LVEF is stable at 55-60%.   There is a trivial paravalvular leak. The mean gradient across   the TAVR is slightly higher at 11 mmHg. LV filling pressure is   elevated - there apperas to be a left pleural effusion, but no   pericardial effusion.   ASSESSMENT & PLAN:   Severe AS s/p TAVR: doing well. Feeling much better after surgery. Groin sites are stable. Continue ASA and plavix. SBE prophylaxis discussed. Amoxil has been called into her pharmacy. I will see her back for 1 month echo and follow up in valve clinic.  HTN: BP mildly elevated today. She says it runs normal at home but she swill watch it  CKD: creat has remained stable ~ 1.6.   Chronic combined S/D CHF: EF 30-35% prior to TAVR. Post procedure echo showed improvement to 55%. She appears euvolemic today. Continue lasix QOD  DMT2: continue current regimen  CAD s/p CABG: continue medical therapy  GERD: continue protonix  Incidental findings: her pre TAVR CTs scans showed a thyroid nodule and two liver lesions. She says that she has had a thyroid nodule for years and it has been monitored over time, but is hasn't been followed here in [place]. She will discuss this with her PCP, Eldridge AbrahamsMike Durant PA-C at their next appointment. She was unaware of any liver abnormalities, so we will order the suggested MRI.   Medication Adjustments/Labs and Tests Ordered: Current medicines are reviewed at length with the patient today.  Concerns regarding medicines are outlined above.  Medication changes, Labs and Tests ordered today are listed in the Patient Instructions below. Patient Instructions  Medication Instructions:  Your provider recommends that you continue on your current medications as directed. Please refer to the Current Medication list given to you today.    Your physician discussed the importance of taking an antibiotic prior to any dental, gastrointestinal,  genitourinary procedures to prevent damage to the heart valves from infection. You have been called in a prescription for AMOXIL 2,000 mg to be taken 1 hour prior to dental visits.   Labwork: None  Testing/Procedures: No new orders   Follow-Up: Please keep your upcoming appointments for your echocardiogram and your office visit with Carlean JewsKatie Montana Bryngelson, PA on June 27. Please arrive by 1:30PM.   Any Other Special Instructions Will Be Listed Below (If Applicable).     If you need a refill on your cardiac medications before your next appointment, please call your pharmacy.      Signed, Cline CrockKathryn Shanetta Nicolls, PA-C  05/20/2018 3:11 PM    [facility] [address], [place], [place]  [address] Phone: [phone]) [phone]; Fax: [phone]

## 2018-05-20 ENCOUNTER — Encounter: Payer: Self-pay | Admitting: Thoracic Surgery (Cardiothoracic Vascular Surgery)

## 2018-05-20 ENCOUNTER — Encounter: Payer: Self-pay | Admitting: Physician Assistant

## 2018-05-20 ENCOUNTER — Ambulatory Visit (INDEPENDENT_AMBULATORY_CARE_PROVIDER_SITE_OTHER): Payer: Medicare Other | Admitting: Physician Assistant

## 2018-05-20 VITALS — BP 146/66 | HR 60 | Ht 63.0 in | Wt 173.0 lb

## 2018-05-20 DIAGNOSIS — I5032 Chronic diastolic (congestive) heart failure: Secondary | ICD-10-CM | POA: Diagnosis not present

## 2018-05-20 DIAGNOSIS — N189 Chronic kidney disease, unspecified: Secondary | ICD-10-CM

## 2018-05-20 DIAGNOSIS — K769 Liver disease, unspecified: Secondary | ICD-10-CM | POA: Diagnosis not present

## 2018-05-20 DIAGNOSIS — E118 Type 2 diabetes mellitus with unspecified complications: Secondary | ICD-10-CM

## 2018-05-20 DIAGNOSIS — K219 Gastro-esophageal reflux disease without esophagitis: Secondary | ICD-10-CM

## 2018-05-20 DIAGNOSIS — I10 Essential (primary) hypertension: Secondary | ICD-10-CM | POA: Diagnosis not present

## 2018-05-20 DIAGNOSIS — Z952 Presence of prosthetic heart valve: Secondary | ICD-10-CM | POA: Diagnosis not present

## 2018-05-20 DIAGNOSIS — I2510 Atherosclerotic heart disease of native coronary artery without angina pectoris: Secondary | ICD-10-CM

## 2018-05-20 MED ORDER — AMOXICILLIN 500 MG PO TABS: ORAL_TABLET | ORAL | 6 refills | Status: AC

## 2018-05-20 NOTE — Patient Instructions (Addendum)
Medication Instructions:  Your provider recommends that you continue on your current medications as directed. Please refer to the Current Medication list given to you today.    Your physician discussed the importance of taking an antibiotic prior to any dental, gastrointestinal, genitourinary procedures to prevent damage to the heart valves from infection. You have been called in a prescription for AMOXIL 2,000 mg to be taken 1 hour prior to dental visits.   Labwork: None  Testing/Procedures: It is recommended you have an MRI of your abdomen.  Follow-Up: Please keep your upcoming appointments for your echocardiogram and your office visit with Sydney JewsKatie Thompson, PA on June 27. Please arrive by 1:30PM.   Any Other Special Instructions Will Be Listed Below (If Applicable).     If you need a refill on your cardiac medications before your next appointment, please call your pharmacy.

## 2018-05-27 ENCOUNTER — Telehealth: Payer: Self-pay | Admitting: Physician Assistant

## 2018-05-27 NOTE — Telephone Encounter (Signed)
Called patient and gave her the date, time and location of her abdominal MR scheduled for Saturday, 05-29-18 at 9 a.m., with arrival at 8:30 a.m., and nothing to eat or drink 4 hours before the test.  I left the number to central scheduling if she needs to reschedule the test.

## 2018-05-28 ENCOUNTER — Encounter: Payer: Self-pay | Admitting: Physician Assistant

## 2018-05-29 ENCOUNTER — Ambulatory Visit (HOSPITAL_COMMUNITY): Admission: RE | Admit: 2018-05-29 | Payer: Medicare Other | Source: Ambulatory Visit

## 2018-06-01 ENCOUNTER — Telehealth: Payer: Self-pay

## 2018-06-01 NOTE — Telephone Encounter (Signed)
-----   Message from Sydney Ingram I Trigloff sent at 06/01/2018  9:41 AM EDT ----- Regarding: MR abdomen   Sydney Ingram  The patient no showed her MR abdomen on 05-29-18.  [place]

## 2018-06-02 ENCOUNTER — Telehealth: Payer: Self-pay | Admitting: Physician Assistant

## 2018-06-02 NOTE — Telephone Encounter (Signed)
Called patient and LVM to call back if MR abdomen is to be rescheduled.

## 2018-06-08 NOTE — Progress Notes (Deleted)
[facility]   MULTIDISCIPLINARY HEART VALVE CLINIC                                       Cardiology Office Note    Date:  06/08/2018   ID:  Sydney Ingram, Sydney Ingram March 21, 1946, MRN [id number]  PCP:  Sherrill Raring, MD  Cardiologist: Dr. Hanley Hays / Dr. Excell Seltzer & Dr. Cornelius Moras (TAVR)  CC: 1 month s/p TAVR  History of Present Illness:  Sydney Ingram is a 72 y.o. 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   She moved to [facility] about 5 years ago and has been followed by Dr. Hanley Hays. Serial echocardiograms have shown progressive aortic stenosis and her most recentdone by Dr. Hanley Hays on 3/21/2019showed a mean transvalvular gradient of 45 mm Hg, AVA of 0.6 cm2 and an EF of 55-60%. She had a repeat echo here on 4/10/2019which showed a possible bicuspid aortic valve that was severely calcified and thickened with restricted mobility. The mean aortic valve gradient was 47 mmHg. The dimensionless index was 0.22. Left ventricular ejection fraction was reduced to 30 to 35% with diffuse hypokinesis with akinesis of the basal and mid inferior and inferolateral walls. There is grade 2 diastolic dysfunction. Cardiac catheterization on 03/24/2018 showed severe native three-vessel coronary disease with total occlusion of the right coronary artery, severe left main stenosis, severe ramus stenosis, and severe proximal LAD stenosis. There is continued patency of the left internal mammary graft to the LAD with total occlusion of the saphenous vein grafts. The mean aortic valve gradient was 20 mmHg with a peak gradient of 50 mmHg.The LVEDP was 18 mm Hg.  She underwent successful TAVR with a 23 mm Edwards Sapien THV via the TF approach on 05/11/18. Post operative echoshowed EF 55%, normally functioning TAVR with trivial PVL with mean gradient 11 mm Hg.    Today she  presents to clinic for follow up.    Past Medical History:  Diagnosis Date  . Aortic stenosis, severe   . CAD (coronary artery disease)   . Chronic combined systolic (congestive) and diastolic (congestive) heart failure (HCC)   . CKD (chronic kidney disease)   . Degenerative lumbar disc   . Diabetes type 2, controlled (HCC)   . Essential hypertension   . GERD (gastroesophageal reflux disease)   . Hyperlipidemia   . Hyperlipidemia   . Mitral regurgitation   . PAD (peripheral artery disease) (HCC)   . Pulmonary hypertension (HCC)   . 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 Thyroid nodule   . Type II diabetes mellitus (HCC)     Past Surgical History:  Procedure Laterality Date  . CORONARY ARTERY BYPASS GRAFT  2009   CABG x3 - [facility]  . INTRAOPERATIVE TRANSTHORACIC ECHOCARDIOGRAM N/A 05/11/2018   Procedure: INTRAOPERATIVE TRANSTHORACIC ECHOCARDIOGRAM;  Surgeon: Tonny Bollman, MD;  Location: [facility] OR;  Service: Open Heart Surgery;  Laterality: N/A;  . LEFT HEART CATH AND CORS/GRAFTS ANGIOGRAPHY N/A 03/24/2018   Procedure: LEFT HEART CATH AND CORS/GRAFTS ANGIOGRAPHY;  Surgeon: Tonny Bollman, MD;  Location: [facility] INVASIVE CV LAB;  Service: Cardiovascular;  Laterality: N/A;  .  PARTIAL HYSTERECTOMY    . PERCUTANEOUS CORONARY STENT INTERVENTION (PCI-S)  2008   details unclear  . TRANSCATHETER AORTIC VALVE REPLACEMENT, TRANSFEMORAL N/A 05/11/2018   Procedure: TRANSCATHETER AORTIC VALVE REPLACEMENT, TRANSFEMORAL;  Surgeon: Tonny Bollman, MD;  Location: [facility] OR;  Service: Open Heart Surgery;  Laterality: N/A;    Current Medications: Outpatient Medications Prior to Visit  Medication Sig Dispense Refill  . amLODipine (NORVASC) 10 MG tablet Take 10 mg by mouth daily.    Marland Kitchen amoxicillin (AMOXIL) 500 MG tablet Take 4 tablets (2,000 mg) one hour prior to  dental visits. 8 tablet 6  . aspirin EC 81 MG tablet Take 81 mg by mouth daily.    Marland Kitchen atorvastatin (LIPITOR) 20 MG tablet Take 20 mg by mouth daily at 6 PM.     . Biotin w/ Vitamins C & E (HAIR/SKIN/NAILS PO) Take 1 tablet by mouth daily.    . carvedilol (COREG) 25 MG tablet Take 25 mg by mouth 2 (two) times daily with a meal.    . Cholecalciferol (VITAMIN D3) 2000 units TABS Take 2,000 Units by mouth daily.    . clopidogrel (PLAVIX) 75 MG tablet Take 1 tablet (75 mg total) by mouth daily. 30 tablet 5  . Coenzyme Q10 200 MG capsule Take 200 mg by mouth daily.     . furosemide (LASIX) 20 MG tablet Take 20 mg by mouth every other day.    Marland Kitchen glipiZIDE (GLUCOTROL XL) 10 MG 24 hr tablet Take 10 mg by mouth daily with breakfast.    . linagliptin (TRADJENTA) 5 MG TABS tablet Take 5 mg by mouth daily.    Marland Kitchen losartan (COZAAR) 50 MG tablet Take 50 mg by mouth daily.    . Multiple Vitamin (MULTIVITAMIN) tablet Take 1 tablet by mouth daily.    . pantoprazole (PROTONIX) 40 MG tablet Take 1 tablet (40 mg total) by mouth daily. 30 tablet 6  . Propylene Glycol (SYSTANE BALANCE) 0.6 % SOLN Place 1 drop into both eyes 2 (two) times daily as needed (eye irritation).     No facility-administered medications prior to visit.      Allergies:   Penicillins   Social History   Socioeconomic History  . Marital status: Legally Separated    Spouse name: Not on file  . Number of children: Not on file  . Years of education: Not on file  . Highest education level: Not on file  Occupational History  . Not on file  Social Needs  . Financial resource strain: Not on file  . Food insecurity:    Worry: Not on file    Inability: Not on file  . Transportation needs:    Medical: Not on file    Non-medical: Not on file  Tobacco Use  . Smoking status: Never Smoker  . Smokeless tobacco: Never Used  Substance and Sexual Activity  . Alcohol use: Not Currently    Frequency: Never  . Drug use: Never  . Sexual activity:  Not Currently  Lifestyle  . Physical activity:    Days per week: Not on file    Minutes per session: Not on file  . Stress: Not on file  Relationships  . Social connections:    Talks on phone: Not on file    Gets together: Not on file    Attends religious service: Not on file    Active member of club or organization: Not on file    Attends meetings of clubs or organizations: Not on file  Relationship status: Not on file  Other Topics Concern  . Not on file  Social History Narrative  . Not on file     Family History:  The patient's family history includes Diabetes in her mother and sister; Heart disease in her brother; Hyperlipidemia in her sister.      *** ROS/PE    Wt Readings from Last 3 Encounters:  05/20/18 173 lb (78.5 kg)  05/12/18 167 lb 12.3 oz (76.1 kg)  05/07/18 167 lb 1.6 oz (75.8 kg)      Studies/Labs Reviewed:   EKG:  EKG is*** ordered today.  The ekg ordered today demonstrates ***  Recent Labs: 05/07/2018: ALT 14; B Natriuretic Peptide 1,702.7 05/12/2018: BUN 25; Creatinine, Ser 1.63; Hemoglobin 10.5; Magnesium 1.9; Platelets 133; Potassium 4.1; Sodium 142   Lipid Panel No results found for: CHOL, TRIG, HDL, CHOLHDL, VLDL, LDLCALC, LDLDIRECT  Additional studies/ records that were reviewed today include:   TAVR OPERATIVE NOTE   Date of Procedure:05/11/2018  Preoperative Diagnosis:Severe Aortic Stenosis  Procedure:   Transcatheter Aortic Valve Replacement - Percutaneous Transfemoral Approach Edwards Sapien 3 THV (size 23mm, model # 9600TFX, serial # [id number])  Co-Surgeons:Clarence H. Cornelius Moraswen, MD and Tonny BollmanMichael Cooper, MD  Pre-operative Echo Findings: ? Severe aortic stenosis ? Moderateleft ventricular systolic dysfunction  Post-operative Echo Findings: ? Noparavalvular leak ? Unchangedleft ventricular systolic function  _________________   Post operative echo  05/12/18 Study Conclusions - Left ventricle: The cavity size was normal. Wall thickness was increased in a pattern of moderate LVH. Systolic function was normal. The estimated ejection fraction was in the range of 55% to 60%. Incoordinate septal motion - basal inferior hypokinesis. The study is not technically sufficient to allow evaluation of LV diastolic function. LV filling pressure is elevated. - Aortic valve: Edwards Sapien 3 TAVR (23 mmHg) - no obstruction with trivial paravalvular leak. Mean gradient (S): 11 mm Hg. Peak gradient (S): 22 mm Hg. Valve area (VTI): 1.58 cm^2. Valve area (Vmax): 1.6 cm^2. Valve area (Vmean): 1.64 cm^2. - Mitral valve: Calcified annulus. Mildly thickened leaflets . There was trivial regurgitation. - Left atrium: The atrium was mildly dilated. - Inferior vena cava: The vessel was normal in size. The respirophasic diameter changes were in the normal range (>= 50%), consistent with normal central venous pressure. - Pericardium, extracardiac: There was a left pleural effusion. Impressions: - Compared to a study on 05/11/2018, the LVEF is stable at 55-60%. There is a trivial paravalvular leak. The mean gradient across the TAVR is slightly higher at 11 mmHg. LV filling pressure is elevated - there apperas to be a left pleural effusion, but no pericardial effusion.  _________________   2D ECHO 06/10/18 (30 days s/p TAVR) ***   ASSESSMENT & PLAN:   Severe AS s/p TAVR: 2D ECHO today shows ***. She / He has NYHA class *** symptoms. SBE prophylaxis discussed; Amoxil called in. ASA / Plavix can be discontinued after 6 months of therapy.   HTN:   CKD:  Chronic combined S/D CHF:   DMT2:  CAD s/p CABG:   GERD:   Incidental findings: her pre TAVR CTs scans showed a thyroid nodule and two liver lesions. She says that she has had a thyroid nodule for years and it has been monitored over time, but is hasn't been  followed here in [place]. She will discuss this with her PCP, Eldridge AbrahamsMike Durant PA-C at their next appointment. She was unaware of any liver abnormalities, so we will order the suggested MRI.  Medication Adjustments/Labs and Tests Ordered: Current medicines are reviewed at length with the patient today.  Concerns regarding medicines are outlined above.  Medication changes, Labs and Tests ordered today are listed in the Patient Instructions below. There are no Patient Instructions on file for this visit.   Signed, Cline Crock, PA-C  06/08/2018 12:14 PM    [facility] [address] [place], [place], [place]  [address] Phone: [phone]; Fax: [phone]

## 2018-06-10 ENCOUNTER — Other Ambulatory Visit (HOSPITAL_COMMUNITY): Payer: Medicare Other

## 2018-06-10 ENCOUNTER — Ambulatory Visit: Payer: Medicare Other | Admitting: Physician Assistant

## 2018-06-11 NOTE — Progress Notes (Signed)
[facility]   MULTIDISCIPLINARY HEART VALVE CLINIC                                       Cardiology Office Note    Date:  06/16/2018   ID:  Sydney Ingram, DOB 1946/11/20, MRN [id number]  PCP:  Sydney Raringosario, Raymond T, MD  Cardiologist:  Dr. Hanley Ingram / Dr. Excell Ingram & Dr. Cornelius Ingram (TAVR)  CC: 1 month s/p TAVR  History of Present Illness:  Sydney Ingram is a 72 y.o. 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    She moved to [facility] about 5 years ago and has been followed by Dr. Hanley Ingram. Serial echocardiograms have shown progressive aortic stenosis and her most recentdone by Dr. Hanley Ingram on 3/21/2019showed a mean transvalvular gradient of 45 mm Hg, AVA of 0.6 cm2 and an EF of 55-60%. She had a repeat echo here on 4/10/2019which showed a possible bicuspid aortic valve that was severely calcified and thickened with restricted mobility. The mean aortic valve gradient was 47 mmHg. The dimensionless index was 0.22. Left ventricular ejection fraction was reduced to 30 to 35% with diffuse hypokinesis with akinesis of the basal and mid inferior and inferolateral walls. There is grade 2 diastolic dysfunction. Cardiac catheterization on 03/24/2018 showed severe native three-vessel coronary disease with total occlusion of the right coronary artery, severe left main stenosis, severe ramus stenosis, and severe proximal LAD stenosis. There is continued patency of the left internal mammary graft to the LAD with total occlusion of the saphenous vein grafts. The mean aortic valve gradient was 20 mmHg with a peak gradient of 50 mmHg.   She underwentsuccessful TAVR with a 23 mm Edwards Sapien THV via the TF approach on 05/11/18. Post operative echoshowed EF 55%, normally functioning TAVR with trivial PVL with mean gradient 11 mm Hg. She was discharged on ASA and plavix.      Today she presents to clinic for follow up. No CP or SOB. She has some mild bilateral LE edema, but no orthopnea or PND. No dizziness or syncope. No blood in stool or urine. No palpitations. She is feeling very good with no real complaints. She is able to get around and do what she likes without limitation.    Past Medical History:  Diagnosis Date  . Aortic stenosis, severe   . CAD (coronary artery disease)   . Chronic combined systolic (congestive) and diastolic (congestive) heart failure (HCC)   . CKD (chronic kidney disease)   . Degenerative lumbar disc   . Diabetes type 2, controlled (HCC)   . Essential hypertension   . GERD (gastroesophageal reflux disease)   . Hyperlipidemia   . Hyperlipidemia   . Mitral regurgitation   . PAD (peripheral artery disease) (HCC)   . Pulmonary hypertension (HCC)   . 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 Thyroid nodule   . Type II diabetes mellitus (HCC)     Past Surgical History:  Procedure Laterality Date  . CORONARY ARTERY BYPASS GRAFT  2009   [facility]  . INTRAOPERATIVE TRANSTHORACIC ECHOCARDIOGRAM N/A 05/11/2018   Procedure: INTRAOPERATIVE TRANSTHORACIC ECHOCARDIOGRAM;  Surgeon: Sydney Bollman, MD;  Location: [facility] OR;  Service: Open Heart Surgery;  Laterality: N/A;  . LEFT HEART CATH AND CORS/GRAFTS ANGIOGRAPHY N/A 03/24/2018   Procedure: LEFT HEART CATH AND CORS/GRAFTS ANGIOGRAPHY;  Surgeon: Sydney Bollman, MD;  Location: [facility] INVASIVE CV LAB;  Service: Cardiovascular;  Laterality: N/A;  . PARTIAL HYSTERECTOMY    . PERCUTANEOUS CORONARY STENT INTERVENTION (PCI-S)  2008   details unclear  . TRANSCATHETER AORTIC VALVE REPLACEMENT, TRANSFEMORAL N/A 05/11/2018   Procedure: TRANSCATHETER AORTIC VALVE REPLACEMENT, TRANSFEMORAL;  Surgeon: Sydney Bollman, MD;  Location: [facility] OR;  Service: Open Heart  Surgery;  Laterality: N/A;    Current Medications: Outpatient Medications Prior to Visit  Medication Sig Dispense Refill  . amLODipine (NORVASC) 10 MG tablet Take 10 mg by mouth daily.    Marland Kitchen amoxicillin (AMOXIL) 500 MG tablet Take 4 tablets (2,000 mg) one hour prior to dental visits. 8 tablet 6  . aspirin EC 81 MG tablet Take 81 mg by mouth daily.    Marland Kitchen atorvastatin (LIPITOR) 20 MG tablet Take 20 mg by mouth daily at 6 PM.     . Biotin w/ Vitamins C & E (HAIR/SKIN/NAILS PO) Take 1 tablet by mouth daily.    . carvedilol (COREG) 25 MG tablet Take 25 mg by mouth 2 (two) times daily with a meal.    . Cholecalciferol (VITAMIN D3) 2000 units TABS Take 2,000 Units by mouth daily.    . clopidogrel (PLAVIX) 75 MG tablet Take 1 tablet (75 mg total) by mouth daily. 30 tablet 5  . Coenzyme Q10 200 MG capsule Take 200 mg by mouth daily.     . furosemide (LASIX) 20 MG tablet Take 20 mg by mouth every other day.    Marland Kitchen glipiZIDE (GLUCOTROL XL) 10 MG 24 hr tablet Take 10 mg by mouth daily with breakfast.    . linagliptin (TRADJENTA) 5 MG TABS tablet Take 5 mg by mouth daily.    Marland Kitchen losartan (COZAAR) 50 MG tablet Take 50 mg by mouth daily.    . Multiple Vitamin (MULTIVITAMIN) tablet Take 1 tablet by mouth daily.    Marland Kitchen Propylene Glycol (SYSTANE BALANCE) 0.6 % SOLN Place 1 drop into both eyes 2 (two) times daily as needed (eye irritation).    . pantoprazole (PROTONIX) 40 MG tablet Take 1 tablet (40 mg total) by mouth daily. 30 tablet 6   No facility-administered medications prior to visit.      Allergies:   Penicillins   Social History   Socioeconomic History  . Marital status: Legally Separated    Spouse name: Not on file  . Number of children: Not on file  . Years of education: Not on file  . Highest education level: Not on file  Occupational History  . Not on file  Social Needs  . Financial resource strain: Not on file  . Food insecurity:    Worry: Not on file    Inability: Not on file  .  Transportation needs:    Medical: Not on file    Non-medical: Not on file  Tobacco Use  . Smoking status: Never Smoker  . Smokeless tobacco: Never Used  Substance and Sexual Activity  . Alcohol use: Not Currently    Frequency: Never  . Drug use: Never  . Sexual activity: Not Currently  Lifestyle  . Physical activity:    Days per week: Not on file    Minutes per session: Not on file  . Stress: Not on file  Relationships  .  Social connections:    Talks on phone: Not on file    Gets together: Not on file    Attends religious service: Not on file    Active member of club or organization: Not on file    Attends meetings of clubs or organizations: Not on file    Relationship status: Not on file  Other Topics Concern  . Not on file  Social History Narrative  . Not on file     Family History:  The patient's family history includes Diabetes in her mother and sister; Heart disease in her brother; Hyperlipidemia in her sister.    ROS:   Please see the history of present illness.    ROS All other systems reviewed and are negative.   PHYSICAL EXAM:   VS:  BP 138/68   Pulse 60   Ht 5\' 3"  (1.6 m)   Wt 168 lb 8 oz (76.4 kg)   BMI 29.85 kg/m    GEN: Well nourished, well developed, in no acute distress  HEENT: normal  Neck: no JVD, carotid bruits, or masses Cardiac: RRR; no murmurs, rubs, or gallops. Trace bilateral LE edema  Respiratory:  clear to auscultation bilaterally, normal work of breathing GI: soft, nontender, nondistended, + BS MS: no deformity or atrophy  Skin: warm and dry, no rash Neuro:  Alert and Oriented x 3, Strength and sensation are intact Psych: euthymic mood, full affect   Wt Readings from Last 3 Encounters:  06/16/18 168 lb 8 oz (76.4 kg)  05/20/18 173 lb (78.5 kg)  05/12/18 167 lb 12.3 oz (76.1 kg)      Studies/Labs Reviewed:   EKG:  EKG is NOT ordered today.    Recent Labs: 05/07/2018: ALT 14; B Natriuretic Peptide 1,702.7 05/12/2018: BUN 25;  Creatinine, Ser 1.63; Hemoglobin 10.5; Magnesium 1.9; Platelets 133; Potassium 4.1; Sodium 142   Lipid Panel No results found for: CHOL, TRIG, HDL, CHOLHDL, VLDL, LDLCALC, LDLDIRECT  Additional studies/ records that were reviewed today include:   TAVR OPERATIVE NOTE   Date of Procedure:05/11/2018  Preoperative Diagnosis:Severe Aortic Stenosis  Procedure:   Transcatheter Aortic Valve Replacement - Percutaneous Transfemoral Approach Edwards Sapien 3 THV (size 23mm, model # 9600TFX, serial # [id number])  Co-Surgeons:Clarence H. Sydney Moras, MD and Sydney Bollman, MD  Pre-operative Echo Findings: ? Severe aortic stenosis ? Moderateleft ventricular systolic dysfunction  Post-operative Echo Findings: ? Noparavalvular leak ? Unchangedleft ventricular systolic function  _________________   Post operative echo 05/12/18 Study Conclusions - Left ventricle: The cavity size was normal. Wall thickness was increased in a pattern of moderate LVH. Systolic function was normal. The estimated ejection fraction was in the range of 55% to 60%. Incoordinate septal motion - basal inferior hypokinesis. The study is not technically sufficient to allow evaluation of LV diastolic function. LV filling pressure is elevated. - Aortic valve: Edwards Sapien 3 TAVR (23 mmHg) - no obstruction with trivial paravalvular leak. Mean gradient (S): 11 mm Hg. Peak gradient (S): 22 mm Hg. Valve area (VTI): 1.58 cm^2. Valve area (Vmax): 1.6 cm^2. Valve area (Vmean): 1.64 cm^2. - Mitral valve: Calcified annulus. Mildly thickened leaflets . There was trivial regurgitation. - Left atrium: The atrium was mildly dilated. - Inferior vena cava: The vessel was normal in size. The respirophasic diameter changes were in the normal range (>= 50%), consistent with normal central venous pressure. - Pericardium, extracardiac: There  was a left pleural effusion. Impressions: - Compared to a study on 05/11/2018, the LVEF is stable at 55-60%.  There is a trivial paravalvular leak. The mean gradient across the TAVR is slightly higher at 11 mmHg. LV filling pressure is elevated - there apperas to be a left pleural effusion, but no pericardial effusion.   _________________   2D ECHO 05/12/18 ( 30 day post op) Study Conclusions  - Left ventricle: The cavity size was normal. Wall thickness was increased in a pattern of moderate LVH. Systolic function was mildly reduced. The estimated ejection fraction was in the range of 45% to 50%. There is inferior hypokinesis to akinesis. Doppler parameters are consistent with restrictive left ventricular relaxation (grade 3 diastolic dysfunction). The E/e&' ratio is >20, suggesting markedly elevated LV filling pressure. - Aortic valve: s/p Edwads Sapien 3 THV - no obstruction. Trivial paravalvular leak. Mean gradient (S): 9 mm Hg. Peak gradient (S): 18 mm Hg. Valve area (VTI): 1.52 cm^2. Valve area (Vmax): 1.34 cm^2. Valve area (Vmean): 1.28 cm^2. - Mitral valve: Calcified annulus. Mildly thickened leaflets . There was mild regurgitation. - Left atrium: The atrium was mildly dilated. - Right ventricle: The cavity size was mildly dilated. Moderate RV systolic dysfunction. Lateral annulus peak S velocity: 5.78 cm/s. - Atrial septum: No defect or patent foramen ovale was identified. - Systemic veins: The IVC measures <2.1 cm, but does not collapse >50%, suggesting an elevated RA pressure of 8 mmHg. - Pericardium, extracardiac: There was no pericardial effusion. Impressions - Compared to a prior study in 04/2018, the LVEF is lower at 45-50% worsening inferior hypokinesis to akinesis, grade 3 DD and high LV filling pressure. The TAVR valve is stable with trivial paravalvular leak.  ASSESSMENT & PLAN:   Severe AS s/p TAVR: 2D ECHO today shows  EF 45-50% (previously as low as 35%) and normally functioning valve with mean gradient 9 mm Hg and trivial PVL. Shehas NYHA class I symptoms. SBE prophylaxis discussed; Amoxil called in. Plavix can be discontinued after 6 months of therapy. I will see her back in 1 year for 1 year echo and follow up.   HTN: BP well controlled today   CKD: creat has been stable around 1.6.   Chroniccombined S/DCHF: EF 30-35% prior to TAVR. Post procedure echo showed improvement to 55%. EF on echo today 45-50%. She appears euvolemic today. Continue lasix QOD   CAD s/p CABG:continue medical therapy.    GERD:continue protonix   Incidental findings: her pre TAVR CTs scans showeda thyroid nodule and two liver lesions. She says that she has had a thyroid nodule for years and it has been monitored over time, but is hasn'Ingram been followed here in [place]. She will discuss this with her PCP, Arnette Felts PA-C at their next appointment. We tried to set up the MRI to evaluate the liver lesions but there was some confusion and the appointment was missed. They would like to have this done closer to home. I will forward my note to Arnette Felts and Dr. Hanley Hays.   Medication Adjustments/Labs and Tests Ordered: Current medicines are reviewed at length with the patient today.  Concerns regarding medicines are outlined above.  Medication changes, Labs and Tests ordered today are listed in the Patient Instructions below. Patient Instructions  Medication Instructions:  1) You may STOP PLAVIX 11/12/2018 unless told otherwise   Labwork: None  Testing/Procedures: Please talk to Dr. Hanley Hays or Arnette Felts about having an abdominal MRI.  Follow-Up: You will be called to arrange your 1 year TAVR office visit and echocardiogram next year!  Any Other Special Instructions Will Be Listed  Below (If Applicable).     If you need a refill on your cardiac medications before your next appointment, please call your pharmacy.        Signed, Cline Crock, PA-C  06/16/2018 9:08 PM     [facility] [address] [place], [place], [place]  [address] Phone: [phone]; Fax: [phone]

## 2018-06-16 ENCOUNTER — Other Ambulatory Visit: Payer: Self-pay | Admitting: Physician Assistant

## 2018-06-16 ENCOUNTER — Ambulatory Visit (HOSPITAL_COMMUNITY): Payer: Medicare Other | Attending: Internal Medicine

## 2018-06-16 ENCOUNTER — Encounter: Payer: Self-pay | Admitting: Physician Assistant

## 2018-06-16 ENCOUNTER — Other Ambulatory Visit: Payer: Self-pay

## 2018-06-16 ENCOUNTER — Ambulatory Visit (INDEPENDENT_AMBULATORY_CARE_PROVIDER_SITE_OTHER): Payer: Medicare Other | Admitting: Physician Assistant

## 2018-06-16 VITALS — BP 138/68 | HR 60 | Ht 63.0 in | Wt 168.5 lb

## 2018-06-16 DIAGNOSIS — I2510 Atherosclerotic heart disease of native coronary artery without angina pectoris: Secondary | ICD-10-CM | POA: Diagnosis not present

## 2018-06-16 DIAGNOSIS — I10 Essential (primary) hypertension: Secondary | ICD-10-CM

## 2018-06-16 DIAGNOSIS — E119 Type 2 diabetes mellitus without complications: Secondary | ICD-10-CM | POA: Insufficient documentation

## 2018-06-16 DIAGNOSIS — E785 Hyperlipidemia, unspecified: Secondary | ICD-10-CM | POA: Insufficient documentation

## 2018-06-16 DIAGNOSIS — N189 Chronic kidney disease, unspecified: Secondary | ICD-10-CM

## 2018-06-16 DIAGNOSIS — Z48812 Encounter for surgical aftercare following surgery on the circulatory system: Secondary | ICD-10-CM | POA: Insufficient documentation

## 2018-06-16 DIAGNOSIS — Z952 Presence of prosthetic heart valve: Secondary | ICD-10-CM | POA: Insufficient documentation

## 2018-06-16 DIAGNOSIS — K219 Gastro-esophageal reflux disease without esophagitis: Secondary | ICD-10-CM | POA: Diagnosis not present

## 2018-06-16 DIAGNOSIS — I110 Hypertensive heart disease with heart failure: Secondary | ICD-10-CM | POA: Insufficient documentation

## 2018-06-16 DIAGNOSIS — K769 Liver disease, unspecified: Secondary | ICD-10-CM

## 2018-06-16 DIAGNOSIS — I5032 Chronic diastolic (congestive) heart failure: Secondary | ICD-10-CM

## 2018-06-16 DIAGNOSIS — I509 Heart failure, unspecified: Secondary | ICD-10-CM | POA: Insufficient documentation

## 2018-06-16 DIAGNOSIS — Z951 Presence of aortocoronary bypass graft: Secondary | ICD-10-CM | POA: Diagnosis not present

## 2018-06-16 MED ORDER — PANTOPRAZOLE SODIUM 40 MG PO TBEC: 40.0000 mg | DELAYED_RELEASE_TABLET | Freq: Every day | ORAL | 3 refills | Status: DC

## 2018-06-16 NOTE — Patient Instructions (Signed)
Medication Instructions:  1) You may STOP PLAVIX 11/12/2018 unless told otherwise   Labwork: None  Testing/Procedures: Please talk to Dr. Hanley Haysosario or Arnette FeltsMike Duran about having an abdominal MRI.  Follow-Up: You will be called to arrange your 1 year TAVR office visit and echocardiogram next year!  Any Other Special Instructions Will Be Listed Below (If Applicable).     If you need a refill on your cardiac medications before your next appointment, please call your pharmacy.

## 2018-07-20 ENCOUNTER — Encounter: Payer: Self-pay | Admitting: Thoracic Surgery (Cardiothoracic Vascular Surgery)

## 2018-08-24 ENCOUNTER — Telehealth: Payer: Self-pay

## 2018-08-24 NOTE — Telephone Encounter (Signed)
I have done a Pantoprazole PA through covermymeds. Key: [id number]

## 2018-08-25 NOTE — Telephone Encounter (Signed)
Letter received from [facility] stating that they have approved the pts Pantoprazole PA. Approval good from 07/25/18 until 08/24/19.

## 2019-03-24 ENCOUNTER — Telehealth: Payer: Self-pay | Admitting: Physician Assistant

## 2019-03-24 ENCOUNTER — Other Ambulatory Visit: Payer: Self-pay | Admitting: Physician Assistant

## 2019-03-24 DIAGNOSIS — Z952 Presence of prosthetic heart valve: Secondary | ICD-10-CM

## 2019-03-24 NOTE — Telephone Encounter (Signed)
Scheduled 1 year TAVR appt with me on 4/20 @ 1pm. Her sister, Luetta NuttingHattie, has an I phone with video chat capabilities. Consent below  Cline CrockKathryn Thompson PA-C  [facility]    THE DAY OF YOUR APPOINTMENT  Approximately 15 minutes prior to your scheduled appointment, you will receive a telephone call from one of [facility] team - your caller ID may say "Unknown caller."  Our staff will confirm medications, vital signs for the day and any symptoms you may be experiencing. Please have this information available prior to the time of visit start. It may also be helpful for you to have a pad of paper and pen handy for any instructions given during your visit. They will also walk you through joining the WebEx smartphone meeting if this is a video visit.    CONSENT FOR TELE-HEALTH VISIT - PLEASE REVIEW  I hereby voluntarily request, consent and authorize [facility] and its employed or contracted physicians, physician assistants, nurse practitioners or other licensed health care professionals (the Practitioner), to provide me with telemedicine health care services (the "Services") as deemed necessary by the treating Practitioner. I acknowledge and consent to receive the Services by the Practitioner via telemedicine. I understand that the telemedicine visit will involve communicating with the Practitioner through live audiovisual communication technology and the disclosure of certain medical information by electronic transmission. I acknowledge that I have been given the opportunity to request an in-person assessment or other available alternative prior to the telemedicine visit and am voluntarily participating in the telemedicine visit.  I understand that I have the right to withhold or withdraw my consent to the use of telemedicine in the course of my care at any time, without affecting my right to future care or treatment, and that the Practitioner or I may terminate the telemedicine visit at any time. I understand that  I have the right to inspect all information obtained and/or recorded in the course of the telemedicine visit and may receive copies of available information for a reasonable fee.  I understand that some of the potential risks of receiving the Services via telemedicine include:  Marland Kitchen. Delay or interruption in medical evaluation due to technological equipment failure or disruption; . Information transmitted may not be sufficient (e.g. poor resolution of images) to allow for appropriate medical decision making by the Practitioner; and/or  . In rare instances, security protocols could fail, causing a breach of personal health information.  Furthermore, I acknowledge that it is my responsibility to provide information about my medical history, conditions and care that is complete and accurate to the best of my ability. I acknowledge that Practitioner's advice, recommendations, and/or decision may be based on factors not within their control, such as incomplete or inaccurate data provided by me or distortions of diagnostic images or specimens that may result from electronic transmissions. I understand that the practice of medicine is not an exact science and that Practitioner makes no warranties or guarantees regarding treatment outcomes. I acknowledge that I will receive a copy of this consent concurrently upon execution via email to the email address I last provided but may also request a printed copy by calling the office of [facility].    I understand that my insurance will be billed for this visit.   I have read or had this consent read to me. . I understand the contents of this consent, which adequately explains the benefits and risks of the Services being provided via telemedicine.  . I have been provided ample  opportunity to ask questions regarding this consent and the Services and have had my questions answered to my satisfaction. . I give my informed consent for the services to be provided through  the use of telemedicine in my medical care  By participating in this telemedicine visit I agree to the above.

## 2019-04-04 ENCOUNTER — Other Ambulatory Visit: Payer: Self-pay

## 2019-04-04 ENCOUNTER — Telehealth (INDEPENDENT_AMBULATORY_CARE_PROVIDER_SITE_OTHER): Payer: Medicare Other | Admitting: Physician Assistant

## 2019-04-04 DIAGNOSIS — K769 Liver disease, unspecified: Secondary | ICD-10-CM

## 2019-04-04 DIAGNOSIS — Z952 Presence of prosthetic heart valve: Secondary | ICD-10-CM

## 2019-04-04 DIAGNOSIS — Z7189 Other specified counseling: Secondary | ICD-10-CM

## 2019-04-04 DIAGNOSIS — I2510 Atherosclerotic heart disease of native coronary artery without angina pectoris: Secondary | ICD-10-CM

## 2019-04-04 NOTE — Progress Notes (Signed)
[facility]   MULTIDISCIPLINARY HEART VALVE TEAM     Virtual Visit via Video Note   This visit type was conducted due to national recommendations for restrictions regarding the COVID-19 Pandemic (e.g. social distancing) in an effort to limit this patient's exposure and mitigate transmission in our community.  Due to her co-morbid illnesses, this patient is at least at moderate risk for complications without adequate follow up.  This format is felt to be most appropriate for this patient at this time.  All issues noted in this document were discussed and addressed.  A limited physical exam was performed with this format.  Please refer to the patient's chart for her consent to telehealth for [facility].   Evaluation Performed:  Follow-up visit  Date:  04/04/2019   ID:  Sydney Ingram, Sydney Ingram [birth year], MRN [id number]  Patient Location: Home Provider Location: Home  PCP:  Sherrill Raring, MD  Cardiologist: Dr. Hanley Hays / Dr. Excell Seltzer & Dr. Cornelius Moras (TAVR)  Chief Complaint:  1 year s/p TAVR  History of Present Illness:    Sydney Ingram is a 73 y.o. 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   The patient does not have symptoms concerning for COVID-19 infection (fever, chills, cough, or new shortness of breath).   She was diagnosed with severe aortic stenosis last year. Pre TAVR cath on 03/24/2018 showed severe native three-vessel coronary disease with total occlusion of the right coronary artery, severe left main stenosis, severe ramus stenosis, and severe proximal LAD stenosis. There was continued patency of the left internal mammary graft to the LAD with total occlusion of the saphenous vein grafts.   She underwentsuccessful TAVR with a 23 mm Edwards Sapien THV via the TF approach on 05/11/18. Post operative echoshowed EF 55%, normally functioning TAVR with trivial PVL  with mean gradient 11 mm Hg.She was discharged on ASA and plavix. 1 month echo showed EF 45-50% (previously as low as 35%) and normally functioning valve with mean gradient 9 mm Hg and trivial PVL.   Of note, her pre TAVR CTs scans showeda thyroid nodule and two liver lesions. Thyroid nodule was benign. She missed the MRI we set up and asked follow with Arnette Felts in [place] for further imaging. These lesions ended up being benign.   Today she presents for telehealth visit. No CP or SOB. No LE edema, orthopnea or PND. No dizziness or syncope. No blood in stool or urine. No palpitations. She is relatively sedentary at baseline. The most exercise she gets is walking out to the dumpster. She has not left the house since the quarantine started.     Past Medical History:  Diagnosis Date  . Aortic stenosis, severe   . CAD (coronary artery disease)   . Chronic combined systolic (congestive) and diastolic (congestive) heart failure (HCC)   . CKD (chronic kidney disease)   . Degenerative lumbar disc   . Diabetes type 2, controlled (HCC)   . Essential hypertension   . GERD (gastroesophageal reflux disease)   . Hyperlipidemia   . Hyperlipidemia   . Mitral regurgitation   . PAD (peripheral artery disease) (HCC)   . Pulmonary hypertension (HCC)   . 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 Thyroid nodule   . Type II  diabetes mellitus (HCC)    Past Surgical History:  Procedure Laterality Date  . CORONARY ARTERY BYPASS GRAFT  2009   [facility]  . INTRAOPERATIVE TRANSTHORACIC ECHOCARDIOGRAM N/A 05/11/2018   Procedure: INTRAOPERATIVE TRANSTHORACIC ECHOCARDIOGRAM;  Surgeon: Tonny Bollmanooper, Michael, MD;  [facility];  Service: Open Heart Surgery;  Laterality: N/A;  . LEFT HEART CATH AND CORS/GRAFTS ANGIOGRAPHY N/A 03/24/2018   Procedure: LEFT HEART CATH  AND CORS/GRAFTS ANGIOGRAPHY;  Surgeon: Tonny Bollmanooper, Michael, MD;  [facility];  Service: Cardiovascular;  Laterality: N/A;  . PARTIAL HYSTERECTOMY    . PERCUTANEOUS CORONARY STENT INTERVENTION (PCI-S)  2008   details unclear  . TRANSCATHETER AORTIC VALVE REPLACEMENT, TRANSFEMORAL N/A 05/11/2018   Procedure: TRANSCATHETER AORTIC VALVE REPLACEMENT, TRANSFEMORAL;  Surgeon: Tonny Bollmanooper, Michael, MD;  [facility];  Service: Open Heart Surgery;  Laterality: N/A;     Current Meds  Medication Sig  . amLODipine (NORVASC) 10 MG tablet Take 10 mg by mouth daily.  Marland Kitchen. amoxicillin (AMOXIL) 500 MG tablet Take 4 tablets (2,000 mg) one hour prior to dental visits.  Marland Kitchen. aspirin EC 81 MG tablet Take 81 mg by mouth daily.  Marland Kitchen. atorvastatin (LIPITOR) 20 MG tablet Take 20 mg by mouth daily at 6 PM.   . Biotin w/ Vitamins C & E (HAIR/SKIN/NAILS PO) Take 1 tablet by mouth daily.  . carvedilol (COREG) 25 MG tablet Take 25 mg by mouth 2 (two) times daily with a meal.  . Cholecalciferol (VITAMIN D3) 2000 units TABS Take 2,000 Units by mouth daily.  . Coenzyme Q10 200 MG capsule Take 200 mg by mouth daily.   . furosemide (LASIX) 20 MG tablet Take 20 mg by mouth every other day.  Marland Kitchen. glipiZIDE (GLUCOTROL XL) 10 MG 24 hr tablet Take 10 mg by mouth daily with breakfast.  . linagliptin (TRADJENTA) 5 MG TABS tablet Take 5 mg by mouth daily.  Marland Kitchen. losartan (COZAAR) 50 MG tablet Take 50 mg by mouth daily.  . Multiple Vitamin (MULTIVITAMIN) tablet Take 1 tablet by mouth daily.  . pantoprazole (PROTONIX) 40 MG tablet Take 1 tablet (40 mg total) by mouth daily.  Marland Kitchen. Propylene Glycol (SYSTANE BALANCE) 0.6 % SOLN Place 1 drop into both eyes 2 (two) times daily as needed (eye irritation).  . [DISCONTINUED] clopidogrel (PLAVIX) 75 MG tablet Take 1 tablet (75 mg total) by mouth daily.     Allergies:   Penicillins   Social History   Tobacco Use  . Smoking status: Never Smoker  . Smokeless tobacco: Never Used  Substance Use  Topics  . Alcohol use: Not Currently    Frequency: Never  . Drug use: Never     Family Hx: The patient's family history includes Diabetes in her mother and sister; Heart disease in her brother; Hyperlipidemia in her sister.  ROS:   Please see the history of present illness.    All other systems reviewed and are negative.   Prior CV studies:   The following studies were reviewed today:  TAVR OPERATIVE NOTE   Date of Procedure:05/11/2018  Preoperative Diagnosis:Severe Aortic Stenosis  Procedure:   Transcatheter Aortic Valve Replacement - Percutaneous Transfemoral Approach Edwards Sapien 3 THV (size 23mm, model # 9600TFX, serial # [id number])  Co-Surgeons:Clarence H. Cornelius Moraswen, MD and Tonny BollmanMichael Cooper, MD  Pre-operative Echo Findings: ? Severe aortic stenosis ? Moderateleft ventricular systolic dysfunction  Post-operative Echo Findings: ? Noparavalvular leak ? Unchangedleft ventricular systolic function  _________________   Post operative echo  05/12/18 Study Conclusions - Left ventricle: The cavity size was normal. Wall thickness was increased in a pattern of moderate LVH. Systolic function was normal. The estimated ejection fraction was in the range of 55% to 60%. Incoordinate septal motion - basal inferior hypokinesis. The study is not technically sufficient to allow evaluation of LV diastolic function. LV filling pressure is elevated. - Aortic valve: Edwards Sapien 3 TAVR (23 mmHg) - no obstruction with trivial paravalvular leak. Mean gradient (S): 11 mm Hg. Peak gradient (S): 22 mm Hg. Valve area (VTI): 1.58 cm^2. Valve area (Vmax): 1.6 cm^2. Valve area (Vmean): 1.64 cm^2. - Mitral valve: Calcified annulus. Mildly thickened leaflets . There was trivial regurgitation. - Left atrium: The atrium was mildly dilated. - Inferior vena cava: The vessel was normal in size. The  respirophasic diameter changes were in the normal range (>= 50%), consistent with normal central venous pressure. - Pericardium, extracardiac: There was a left pleural effusion. Impressions: - Compared to a study on 05/11/2018, the LVEF is stable at 55-60%. There is a trivial paravalvular leak. The mean gradient across the TAVR is slightly higher at 11 mmHg. LV filling pressure is elevated - there apperas to be a left pleural effusion, but no pericardial effusion.   _________________   2D ECHO 05/12/18 ( 30 day post op) Study Conclusions  - Left ventricle: The cavity size was normal. Wall thickness was increased in a pattern of moderate LVH. Systolic function was mildly reduced. The estimated ejection fraction was in the range of 45% to 50%. There is inferior hypokinesis to akinesis. Doppler parameters are consistent with restrictive left ventricular relaxation (grade 3 diastolic dysfunction). The E/e&' ratio is >20, suggesting markedly elevated LV filling pressure. - Aortic valve: s/p Edwads Sapien 3 THV - no obstruction. Trivial paravalvular leak. Mean gradient (S): 9 mm Hg. Peak gradient (S): 18 mm Hg. Valve area (VTI): 1.52 cm^2. Valve area (Vmax): 1.34 cm^2. Valve area (Vmean): 1.28 cm^2. - Mitral valve: Calcified annulus. Mildly thickened leaflets . There was mild regurgitation. - Left atrium: The atrium was mildly dilated. - Right ventricle: The cavity size was mildly dilated. Moderate RV systolic dysfunction. Lateral annulus peak S velocity: 5.78 cm/s. - Atrial septum: No defect or patent foramen ovale was identified. - Systemic veins: The IVC measures <2.1 cm, but does not collapse >50%, suggesting an elevated RA pressure of 8 mmHg. - Pericardium, extracardiac: There was no pericardial effusion. Impressions - Compared to a prior study in 04/2018, the LVEF is lower at 45-50% worsening inferior hypokinesis to akinesis, grade 3 DD  and high LV filling pressure. The TAVR valve is stable with trivial paravalvular leak.  Labs/Other Tests and Data Reviewed:    EKG:  No ECG reviewed.  Recent Labs: 05/07/2018: ALT 14; B Natriuretic Peptide 1,702.7 05/12/2018: BUN 25; Creatinine, Ser 1.63; Hemoglobin 10.5; Magnesium 1.9; Platelets 133; Potassium 4.1; Sodium 142   Recent Lipid Panel No results found for: CHOL, TRIG, HDL, CHOLHDL, LDLCALC, LDLDIRECT  Wt Readings from Last 3 Encounters:  06/16/18 168 lb 8 oz (76.4 kg)  05/20/18 173 lb (78.5 kg)  05/12/18 167 lb 12.3 oz (76.1 kg)     Objective:    Vital Signs:  There were no vitals taken for this visit.   Well nourished, well developed female in no acute distress. No LE edema.    ASSESSMENT & PLAN:    Severe AS  S/p TAVR: doing quite well with NYHA class I symptoms. She has amoxicillin for SBE prophylaxis. K[facility]Q  completed over the phone. Echo pushed out until July given Covid 19 pandemic.   Incidental findings: thyroid lesion was biopsied and benign. MRI of liver lesions were also benign her patient and sister Hattie.   CAD: stable. Continue aspirin, statin and BB.  COVID-19 Education: the signs and symptoms of COVID-19 were discussed with the patient and how to seek care for testing (follow up with PCP or arrange E-visit).  The importance of social distancing was discussed today.  Time:   Today, I have spent 25 minutes with the patient with telehealth technology discussing the above problems.     Medication Adjustments/Labs and Tests Ordered: Current medicines are reviewed at length with the patient today.  Concerns regarding medicines are outlined above.   Tests Ordered: No orders of the defined types were placed in this encounter.   Medication Changes: No orders of the defined types were placed in this encounter.   Disposition:  Follow up with Arnette Felts as previously scheduled.   Signed, Cline Crock, PA-C  04/04/2019 1:22 PM    [facility]

## 2019-04-04 NOTE — Patient Instructions (Signed)
Hello Ms Sitze,   It was so nice to talk to you on the audio/video chat today. I am so glad you are doing so well. I just wanted to send you a recap of our discussion.   Please continue taking antibiotics prior to any dental work including cleanings (amoxicillin). Please continue on all your same medications.    Your 1 year echo has been rescheduled to July . Please continue regular follow up with you primary cardiologist.   All appointment details are listed in upcoming appointments in MyChart or attached to this letter in your "after visit summary."  Please call us with any questions or concerns you may have and please stay safe during these uncertain times.  Carlean Jews

## 2019-04-14 ENCOUNTER — Telehealth: Payer: Self-pay | Admitting: Physician Assistant

## 2019-04-14 NOTE — Telephone Encounter (Signed)
Spoke with pt and pt needed to change time of echo due to transportation issues. Pt  prefer morning appt changed to 10:30 am .Sydney Ingram

## 2019-04-14 NOTE — Telephone Encounter (Signed)
New Message:   Patient calling concering her ECHO be change. Please call patient.

## 2019-06-08 ENCOUNTER — Encounter: Payer: Self-pay | Admitting: Thoracic Surgery (Cardiothoracic Vascular Surgery)

## 2019-07-05 ENCOUNTER — Other Ambulatory Visit: Payer: Self-pay | Admitting: Cardiovascular Disease

## 2019-07-05 MED ORDER — PANTOPRAZOLE SODIUM 40 MG PO TBEC: 40.0000 mg | DELAYED_RELEASE_TABLET | Freq: Every day | ORAL | 2 refills | Status: DC

## 2019-07-12 ENCOUNTER — Other Ambulatory Visit (HOSPITAL_COMMUNITY): Payer: Medicare Other

## 2019-10-05 IMAGING — CT CT ANGIO CHEST
1 of 5 series · 1 of 28 positions shown · IV contrast (iopamidol)
Comparison: No priors.

CLINICAL DATA: 71-year-old female with history of severe aortic
stenosis. Preprocedural study prior to potential transcatheter
aortic valve replacement (TAVR).

EXAM:
CT ANGIOGRAPHY CHEST, ABDOMEN AND PELVIS
TECHNIQUE: Multidetector CT imaging through the chest, abdomen and pelvis was
performed using the standard protocol during bolus administration of
intravenous contrast. Multiplanar reconstructed images and MIPs were
obtained and reviewed to evaluate the vascular anatomy.
CONTRAST:  100mL A1Y2IR-2EU IOPAMIDOL (A1Y2IR-2EU) INJECTION 76%

[Series 236: — · 0.40mm/px · 1 of 10 slices shown]
[im 5/10]
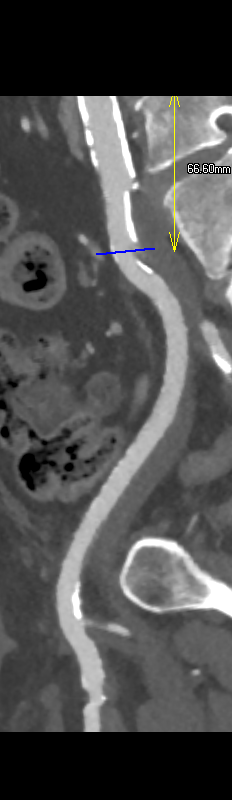

[1 of 28 positions shown; findings below may reference images not displayed]

FINDINGS: CTA CHEST FINDINGS

Cardiovascular: Heart size is mildly enlarged with left atrial
dilatation. Filling defect within the tip of the left atrial
appendage (axial image 40 of series 14), concerning for potential
left atrial appendage thrombus. There is aortic atherosclerosis, as
well as atherosclerosis of the great vessels of the mediastinum and
the coronary arteries, including calcified atherosclerotic plaque in
the left main, left anterior descending, left circumflex and right
coronary arteries. Status post median sternotomy for CABG including
[REDACTED] to the LAD. Severe thickening calcification of the aortic
valve. Mild calcifications of the mitral annulus and valve.

Mediastinum/Lymph Nodes: No pathologically enlarged mediastinal or
hilar lymph nodes. Esophagus is unremarkable in appearance. No
axillary lymphadenopathy. Large heterogeneously enhancing nodule
extending anteriorly from the isthmus of the thyroid gland measuring
3.3 x 2.9 cm.

Lungs/Pleura: No suspicious appearing pulmonary nodules or masses.
No acute consolidative airspace disease. No pleural effusions.

Musculoskeletal/Soft Tissues: Median sternotomy wires. There are no
aggressive appearing lytic or blastic lesions noted in the
visualized portions of the skeleton.

CTA ABDOMEN AND PELVIS FINDINGS

Hepatobiliary: There are 2 lesions in the liver, both in the right
lobe, largest of which measures 2.4 x 1.6 cm predominantly in
segment 8 of the liver (axial image 230 of series 13). Both of these
lesions appear centrally low-attenuation with peripheral nodular
enhancement, suggestive of cavernous hemangiomas, but incompletely
characterized on today's arterial phase examination. No other
hepatic lesions. No intra or extrahepatic biliary ductal dilatation.
Small calcified gallstones measuring up to 6 mm lying dependently in
the gallbladder. No surrounding inflammatory changes are noted to
suggest an acute cholecystitis at this time.

Pancreas: No pancreatic mass. No pancreatic ductal dilatation. No
pancreatic or peripancreatic fluid or inflammatory changes.

Spleen: Unremarkable.

Adrenals/Urinary Tract: 1.7 cm simple cyst in the interpolar region
of the right kidney. Severe multifocal cortical thinning throughout
the kidneys bilaterally. No suspicious renal lesions. No
hydroureteronephrosis. Urinary bladder is normal in appearance.
Bilateral adrenal glands are normal in appearance.

Stomach/Bowel: Normal appearance of the stomach. No pathologic
dilatation of small bowel or colon. The appendix is not confidently
identified and may be surgically absent. Regardless, there are no
inflammatory changes noted adjacent to the cecum to suggest the
presence of an acute appendicitis at this time.

Vascular/Lymphatic: Extensive aortic atherosclerosis with vascular
findings and measurements pertinent to potential TAVR procedure, as
detailed below. Celiac axis, superior mesenteric artery and inferior
mesenteric artery are all widely patent without hemodynamically
significant stenosis. Single right and 2 left renal arteries are all
widely patent without hemodynamically significant stenosis. No
lymphadenopathy noted in the abdomen or pelvis.

Reproductive: Uterus and ovaries are unremarkable in appearance.

Other: No significant volume of ascites.  No pneumoperitoneum.

Musculoskeletal: There are no aggressive appearing lytic or blastic
lesions noted in the visualized portions of the skeleton.

VASCULAR MEASUREMENTS PERTINENT TO TAVR:

AORTA:

Minimal Aortic Biameter-D x 12 mm

Severity of Aortic Calcification-severe

RIGHT PELVIS:

Right Common Iliac Artery -

Minimal Miameter-MW.4 x 9.5 mm

Tortuosity-mild

Calcification-severe

Right External Iliac Artery -

Minimal 7iameter-N.H x 5.8 mm

Tortuosity-mild

Calcification-mild

Right Common Femoral Artery -

Minimal Diameter-G.J x 5.5 mm

Tortuosity-mild

Calcification-mild

LEFT PELVIS:

Left Common Iliac Artery -

Minimal Jiameter-C.7 x 3.9 mm

Tortuosity-mild

Calcification-severe

Left External Iliac Artery -

Minimal Niameter-I.9 x 6.8 mm

Tortuosity-mild

Calcification-mild

Left Common Femoral Artery -

Minimal 2iameter-V.D x 5.5 mm

Tortuosity-mild

Calcification-mild-to-moderate

Review of the MIP images confirms the above findings.
IMPRESSION: 1. Vascular findings and measurements pertinent to potential TAVR
procedure, as detailed above.
2. Severe thickening calcification of the aortic valve, compatible
with the reported clinical history of severe aortic stenosis.
3. Cardiomegaly with left atrial dilatation. Notably, there is a
filling defect in the tip of the left atrial appendage concerning
for potential left atrial appendage thrombus (although this could
simply reflect so-called "pseudo thrombus", related to incomplete
mixing of contrast). Correlation with transesophageal
echocardiography is suggested if not already recently obtained.
4. Aortic atherosclerosis, in addition to left main and 3 vessel
coronary artery disease. Status post median sternotomy for CABG
including [REDACTED] to the LAD.
5. Heterogeneously enhancing nodule extending anteriorly from the
isthmus of the thyroid gland measuring 3.3 x 2.9 cm. Further
evaluation with nonemergent thyroid ultrasound is strongly
recommended in the near future to determine better evaluate this
lesion and determine the need for potential fine-needle aspiration.
6. Two lesions in the liver, as above, which are favored to
represent cavernous hemangiomas. These are incompletely evaluated on
today's arterial phase examination and could be definitively
characterized with MRI of the abdomen with and without IV
gadolinium.
7. Cholelithiasis without evidence of acute cholecystitis at this
time.
8. Additional incidental findings, as above.

Aortic Atherosclerosis ([id number]-XOA.A).

## 2019-10-13 ENCOUNTER — Telehealth: Payer: Self-pay

## 2019-10-13 NOTE — Telephone Encounter (Signed)
**Note De-Identified Deondra Wigger Obfuscation** I did a Pantoprazole PA.  Letter received from [facility] stating that they have approved the pts Pantoprazole PA. Approval is good until 10/09/2020.  I have notified the pts pharmacy of this approval.

## 2020-01-04 ENCOUNTER — Other Ambulatory Visit: Payer: Self-pay | Admitting: Cardiovascular Disease

## 2020-10-09 ENCOUNTER — Other Ambulatory Visit: Payer: Self-pay | Admitting: Cardiovascular Disease

## 2020-10-09 NOTE — Telephone Encounter (Signed)
Pt's pharmacy is requesting a refill on pantoprazole. Would Dr. Cooper like to refill this medication? Please address 

## 2021-01-04 ENCOUNTER — Other Ambulatory Visit: Payer: Self-pay | Admitting: Cardiovascular Disease

## 2023-06-15 DEATH — deceased
